# Patient Record
Sex: Female | Born: 1950 | Race: White | Hispanic: No | Marital: Married | Smoking: Former smoker
Health system: Southern US, Community
[De-identification: ages and names within clinical notes are randomized; demographics above are authoritative.]

## PROBLEM LIST (undated history)

## (undated) DIAGNOSIS — F32A Depression, unspecified: Secondary | ICD-10-CM

## (undated) DIAGNOSIS — R7301 Impaired fasting glucose: Secondary | ICD-10-CM

## (undated) DIAGNOSIS — E039 Hypothyroidism, unspecified: Secondary | ICD-10-CM

## (undated) DIAGNOSIS — E049 Nontoxic goiter, unspecified: Secondary | ICD-10-CM

## (undated) DIAGNOSIS — M1990 Unspecified osteoarthritis, unspecified site: Secondary | ICD-10-CM

## (undated) DIAGNOSIS — R7303 Prediabetes: Secondary | ICD-10-CM

## (undated) DIAGNOSIS — I10 Essential (primary) hypertension: Secondary | ICD-10-CM

## (undated) DIAGNOSIS — K219 Gastro-esophageal reflux disease without esophagitis: Secondary | ICD-10-CM

## (undated) DIAGNOSIS — C801 Malignant (primary) neoplasm, unspecified: Secondary | ICD-10-CM

## (undated) DIAGNOSIS — D649 Anemia, unspecified: Secondary | ICD-10-CM

## (undated) DIAGNOSIS — Z8719 Personal history of other diseases of the digestive system: Secondary | ICD-10-CM

## (undated) DIAGNOSIS — F4329 Adjustment disorder with other symptoms: Secondary | ICD-10-CM

## (undated) DIAGNOSIS — I4891 Unspecified atrial fibrillation: Secondary | ICD-10-CM

## (undated) DIAGNOSIS — R0602 Shortness of breath: Secondary | ICD-10-CM

## (undated) DIAGNOSIS — Z9109 Other allergy status, other than to drugs and biological substances: Secondary | ICD-10-CM

## (undated) DIAGNOSIS — J189 Pneumonia, unspecified organism: Secondary | ICD-10-CM

## (undated) DIAGNOSIS — M8580 Other specified disorders of bone density and structure, unspecified site: Secondary | ICD-10-CM

## (undated) HISTORY — DX: Adjustment disorder with other symptoms: F43.29

## (undated) HISTORY — DX: Other allergy status, other than to drugs and biological substances: Z91.09

## (undated) HISTORY — DX: Anemia, unspecified: D64.9

## (undated) HISTORY — PX: TONSILLECTOMY: SUR1361

## (undated) HISTORY — PX: OVARIAN CYST REMOVAL: SHX89

## (undated) HISTORY — DX: Gastro-esophageal reflux disease without esophagitis: K21.9

## (undated) HISTORY — DX: Hypothyroidism, unspecified: E03.9

## (undated) HISTORY — DX: Impaired fasting glucose: R73.01

## (undated) HISTORY — DX: Nontoxic goiter, unspecified: E04.9

## (undated) HISTORY — DX: Unspecified atrial fibrillation: I48.91

## (undated) HISTORY — DX: Other specified disorders of bone density and structure, unspecified site: M85.80

## (undated) HISTORY — DX: Shortness of breath: R06.02

## (undated) HISTORY — DX: Prediabetes: R73.03

---
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
## 1978-06-14 HISTORY — PX: OVARIAN CYST REMOVAL: SHX89

## 1998-05-27 ENCOUNTER — Other Ambulatory Visit: Admission: RE | Admit: 1998-05-27 | Discharge: 1998-05-27 | Payer: Self-pay | Admitting: Obstetrics and Gynecology

## 1999-09-23 ENCOUNTER — Other Ambulatory Visit: Admission: RE | Admit: 1999-09-23 | Discharge: 1999-09-23 | Payer: Self-pay | Admitting: Obstetrics and Gynecology

## 1999-10-14 ENCOUNTER — Encounter: Admission: RE | Admit: 1999-10-14 | Discharge: 1999-10-14 | Payer: Self-pay | Admitting: Obstetrics and Gynecology

## 1999-10-14 ENCOUNTER — Encounter: Payer: Self-pay | Admitting: Obstetrics and Gynecology

## 2002-05-15 ENCOUNTER — Other Ambulatory Visit: Admission: RE | Admit: 2002-05-15 | Discharge: 2002-05-15 | Payer: Self-pay | Admitting: Obstetrics and Gynecology

## 2008-01-26 ENCOUNTER — Ambulatory Visit: Payer: Self-pay | Admitting: Internal Medicine

## 2008-02-02 ENCOUNTER — Encounter: Payer: Self-pay | Admitting: Internal Medicine

## 2008-02-02 ENCOUNTER — Ambulatory Visit: Payer: Self-pay | Admitting: Internal Medicine

## 2008-02-05 ENCOUNTER — Encounter: Payer: Self-pay | Admitting: Internal Medicine

## 2008-10-03 ENCOUNTER — Encounter: Admission: RE | Admit: 2008-10-03 | Discharge: 2008-10-03 | Payer: Self-pay | Admitting: Endocrinology

## 2008-10-17 ENCOUNTER — Other Ambulatory Visit: Admission: RE | Admit: 2008-10-17 | Discharge: 2008-10-17 | Payer: Self-pay | Admitting: Interventional Radiology

## 2008-10-17 ENCOUNTER — Encounter (INDEPENDENT_AMBULATORY_CARE_PROVIDER_SITE_OTHER): Payer: Self-pay | Admitting: Interventional Radiology

## 2008-10-17 ENCOUNTER — Encounter: Admission: RE | Admit: 2008-10-17 | Discharge: 2008-10-17 | Payer: Self-pay | Admitting: Endocrinology

## 2009-09-17 ENCOUNTER — Encounter: Admission: RE | Admit: 2009-09-17 | Discharge: 2009-09-17 | Payer: Self-pay | Admitting: Endocrinology

## 2010-03-20 ENCOUNTER — Encounter: Admission: RE | Admit: 2010-03-20 | Discharge: 2010-03-20 | Payer: Self-pay | Admitting: Internal Medicine

## 2011-04-05 IMAGING — US US BIOPSY
1 series · 9 of 9 positions shown · non-contrast
Comparison: none

CLINICAL DATA: Left thyroid nodule

[Series 1: us biopsy · 9 acquisitions, 9 frames shown]
[im 1/9]
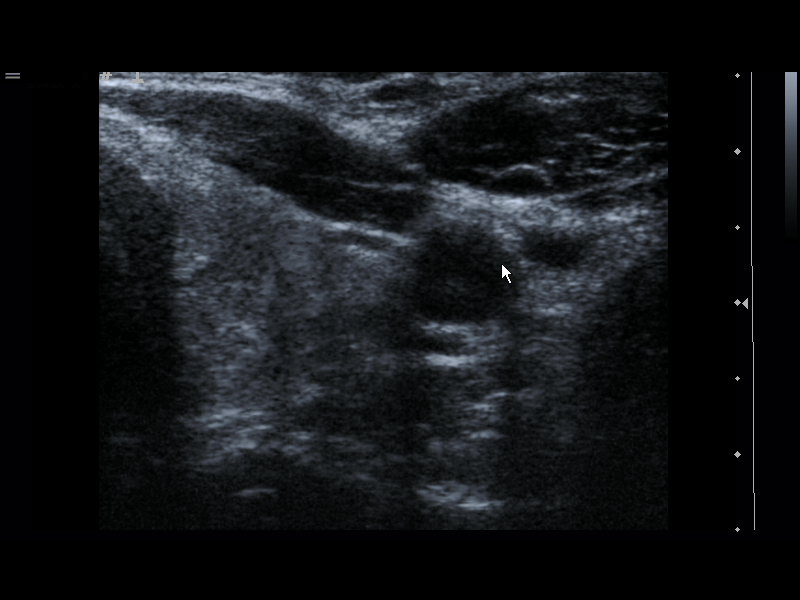
[im 2/9]
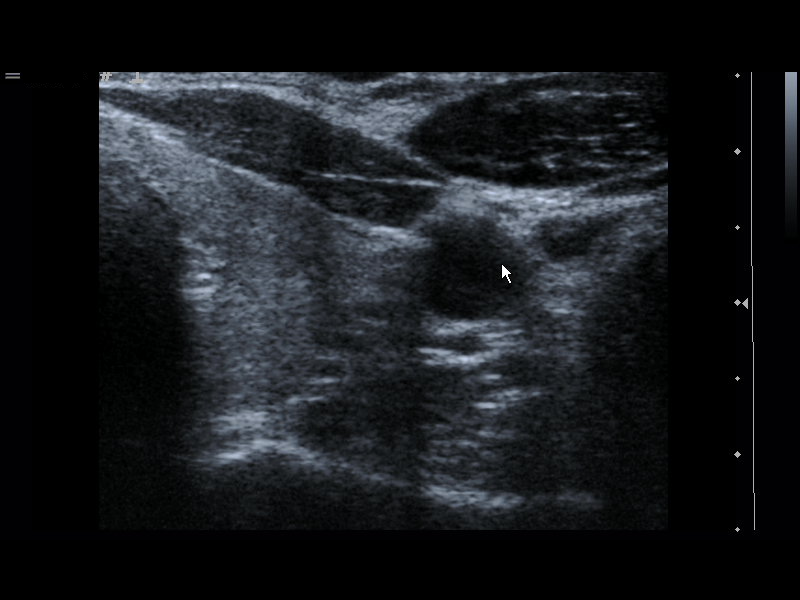
[im 3/9]
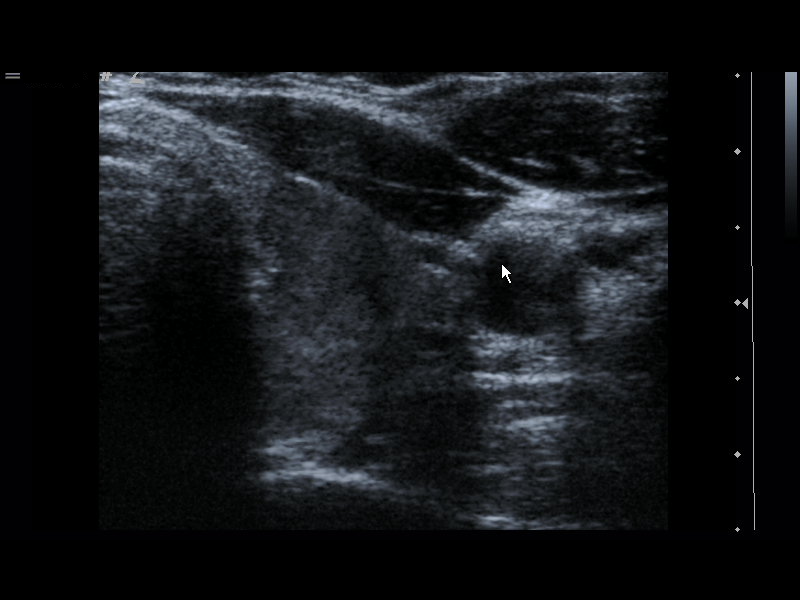
[im 4/9]
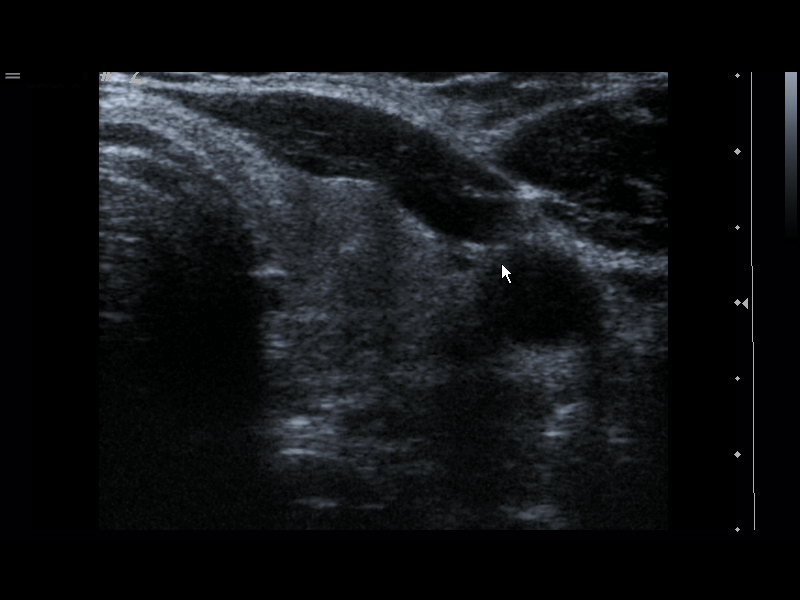
[im 5/9]
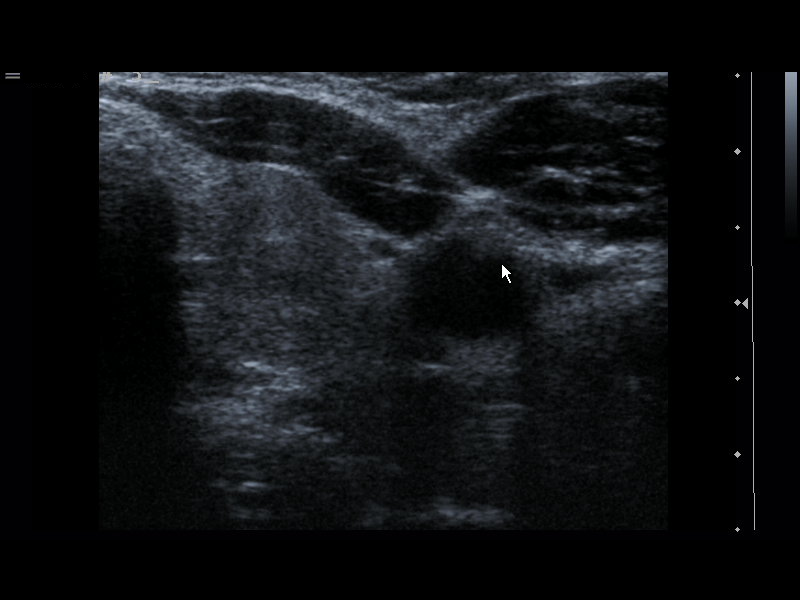
[im 6/9]
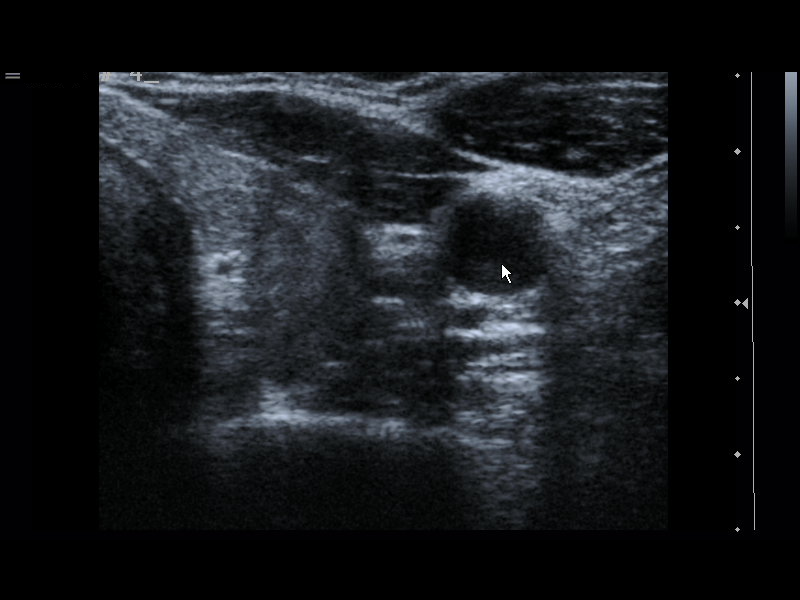
[im 7/9]
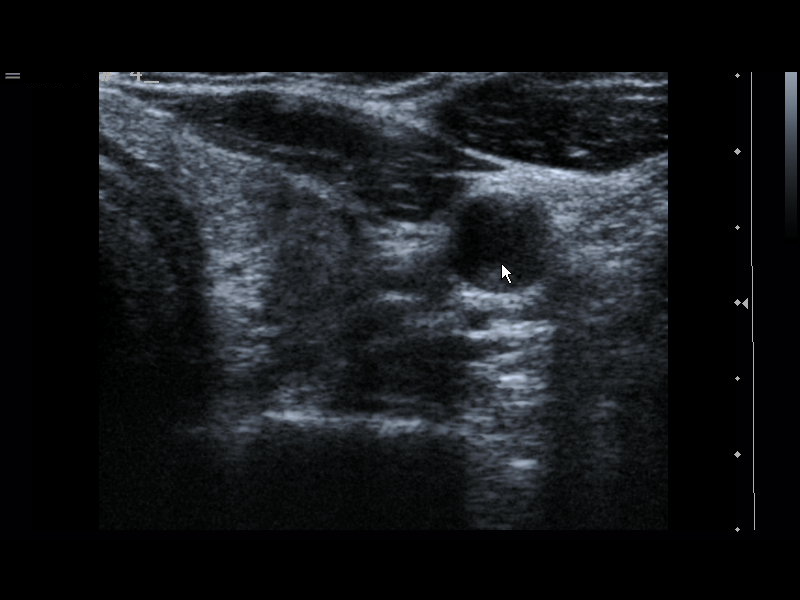
[im 8/9]
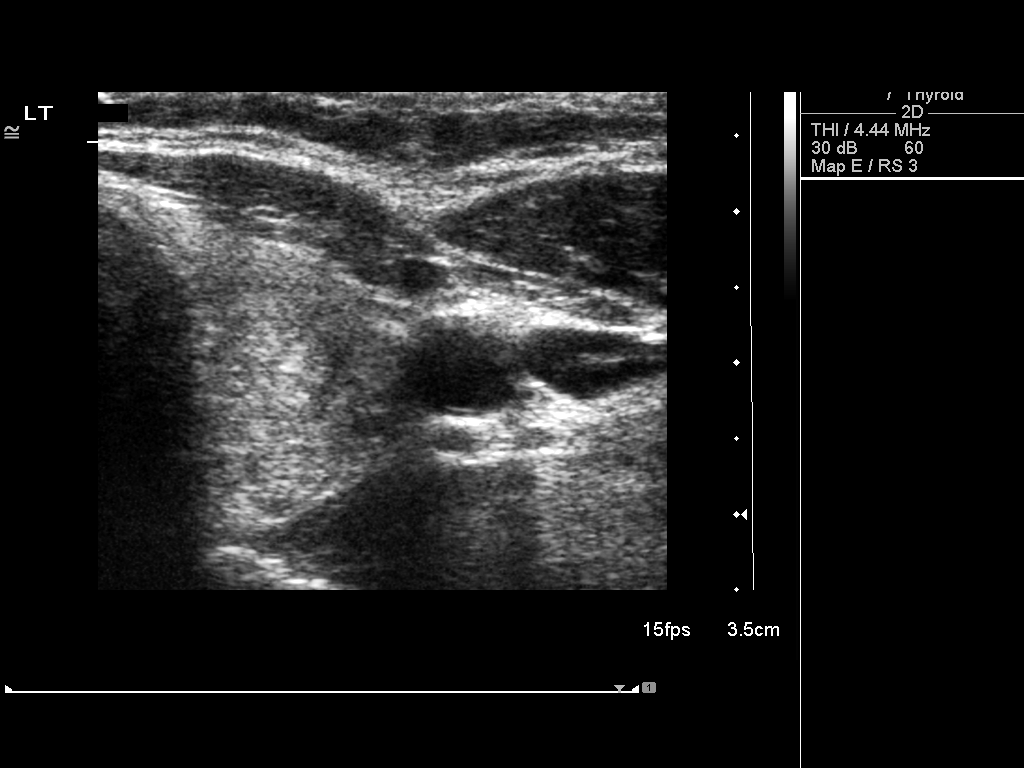
[im 9/9]
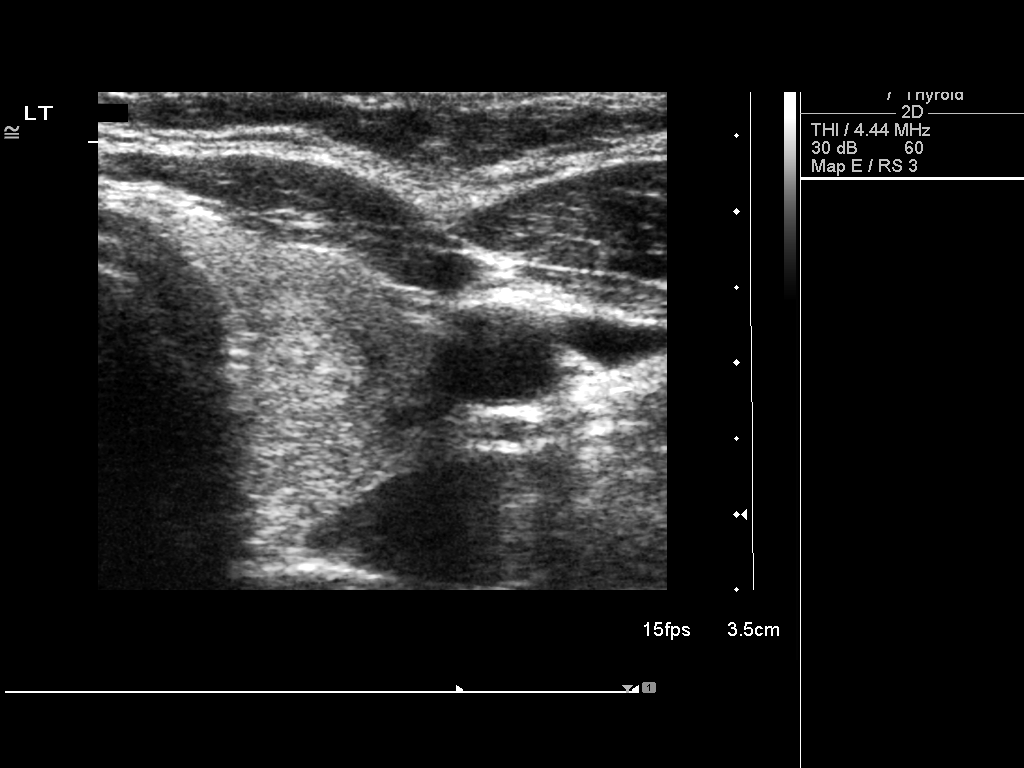

[9 of 9 positions shown; findings below may reference images not displayed]

ULTRASOUND-GUIDED NEEDLE ASPIRATE BIOPSY, LEFT LOBE OF THYROID

The above procedure was discussed with the patient and written
informed consent was obtained. Findings: Ultrasound was performed
to localize and mark an adequate site for the biopsy.  The patient
was then prepped and draped in a normal sterile fashion.  Local
anesthesia was provided with 1% lidocaine.  Using direct ultrasound
guidance, 4 passes were made using 25g needles into the nodule
within the left lobe of the thyroid.  Ultrasound was used to
confirm needle placements on all occasions.  Specimens were sent to
Pathology for analysis.  Post procedural imaging demonstrated no
hematoma or immediate complication.  The patient tolerated the
procedure well.
IMPRESSION: Successful ultrasound guided biopsy of left thyroid
nodule.  The pathology pending.

Read by: Ung, Ahya.-JUMPER

## 2014-02-05 ENCOUNTER — Encounter: Payer: Self-pay | Admitting: Internal Medicine

## 2017-07-27 DIAGNOSIS — M25562 Pain in left knee: Secondary | ICD-10-CM | POA: Diagnosis not present

## 2017-07-29 DIAGNOSIS — R7301 Impaired fasting glucose: Secondary | ICD-10-CM | POA: Diagnosis not present

## 2017-07-29 DIAGNOSIS — E7800 Pure hypercholesterolemia, unspecified: Secondary | ICD-10-CM | POA: Diagnosis not present

## 2017-07-29 DIAGNOSIS — E049 Nontoxic goiter, unspecified: Secondary | ICD-10-CM | POA: Diagnosis not present

## 2017-08-05 DIAGNOSIS — E049 Nontoxic goiter, unspecified: Secondary | ICD-10-CM | POA: Diagnosis not present

## 2017-08-05 DIAGNOSIS — E7800 Pure hypercholesterolemia, unspecified: Secondary | ICD-10-CM | POA: Diagnosis not present

## 2017-08-05 DIAGNOSIS — E039 Hypothyroidism, unspecified: Secondary | ICD-10-CM | POA: Diagnosis not present

## 2017-08-05 DIAGNOSIS — R7301 Impaired fasting glucose: Secondary | ICD-10-CM | POA: Diagnosis not present

## 2017-08-26 DIAGNOSIS — Z1231 Encounter for screening mammogram for malignant neoplasm of breast: Secondary | ICD-10-CM | POA: Diagnosis not present

## 2017-10-25 ENCOUNTER — Telehealth: Payer: Self-pay | Admitting: Orthopedic Surgery

## 2017-10-25 NOTE — Telephone Encounter (Signed)
Erroneous / No Note

## 2017-11-29 ENCOUNTER — Encounter: Payer: Self-pay | Admitting: Internal Medicine

## 2018-06-21 ENCOUNTER — Ambulatory Visit (INDEPENDENT_AMBULATORY_CARE_PROVIDER_SITE_OTHER): Payer: PPO

## 2018-06-21 ENCOUNTER — Encounter: Payer: Self-pay | Admitting: Podiatry

## 2018-06-21 ENCOUNTER — Ambulatory Visit: Payer: PPO | Admitting: Podiatry

## 2018-06-21 ENCOUNTER — Other Ambulatory Visit: Payer: Self-pay | Admitting: Podiatry

## 2018-06-21 VITALS — BP 118/79 | HR 66

## 2018-06-21 DIAGNOSIS — M722 Plantar fascial fibromatosis: Secondary | ICD-10-CM

## 2018-06-21 DIAGNOSIS — M79672 Pain in left foot: Secondary | ICD-10-CM

## 2018-06-21 MED ORDER — TRIAMCINOLONE ACETONIDE 10 MG/ML IJ SUSP
10.0000 mg | Freq: Once | INTRAMUSCULAR | Status: AC
  Administered 2018-06-21: 10 mg

## 2018-06-21 NOTE — Progress Notes (Signed)
Subjective:   Patient ID: Kristin Bishop, female   DOB: 68 y.o.   MRN: [id number]   HPI Patient states she has a lot of pain in her plantar heel for approximate 6 months duration and states she is had orthotics in the past but not for a number of years and those have been beneficial previously.  Patient does not smoke likes to be active   Review of Systems  All other systems reviewed and are negative.       Objective:  Physical Exam Vitals signs and nursing note reviewed.  Constitutional:      Appearance: She is well-developed.  Pulmonary:     Effort: Pulmonary effort is normal.  Musculoskeletal: Normal range of motion.  Skin:    General: Skin is warm.  Neurological:     Mental Status: She is alert.     Neurovascular status intact muscle strength is adequate range of motion within normal limits with exquisite discomfort plantar aspect heel left at the insertional point of the tendon calcaneus with depression of the arch was a complicating factor to her condition.  Noted to have good digital perfusion well oriented x3     Assessment:  Acute plantar fasciitis left with inflammation fluid around the medial band depression of the arch noted     Plan:  H&P x-rays reviewed with patient.  Today I injected the plantar fashion after sterile prep 3 mg Kenalog 5 mg Xylocaine and applied fascial brace to lift up the arch with instructions on usage.  Understands importance of the fascial brace due to the collapse of her arch and long-term will probably get her into new orthotics  X-rays indicate small spur formation no indications of stress fracture with moderate depression of the arch

## 2018-06-21 NOTE — Patient Instructions (Signed)

## 2018-07-05 ENCOUNTER — Ambulatory Visit: Payer: PPO | Admitting: Podiatry

## 2018-07-05 ENCOUNTER — Encounter: Payer: Self-pay | Admitting: Podiatry

## 2018-07-05 DIAGNOSIS — M722 Plantar fascial fibromatosis: Secondary | ICD-10-CM

## 2018-07-05 NOTE — Progress Notes (Signed)
Subjective:   Patient ID: Kristin Bishop, female   DOB: 68 y.o.   MRN: [id number]   HPI Patient states still having quite a bit of pain in my left heel and I would really like to avoid surgery and I feel like I have trouble bearing weight on the heel at times   ROS      Objective:  Physical Exam  Neurovascular status intact with patient's left heel still showing discomfort upon palpation with chronic nature to this condition of at least 6 to 8 months duration     Assessment:  Chronic fasciitis left foot patient has great difficulty keeping off of it but would like to have reduced weightbearing     Plan:  We are trying to avoid surgery on this patient and at this point I went ahead and dispensed air fracture walker to completely eliminate weightbearing forces on the heel and discussed possibility for shockwave if symptoms do not improve in the next 3 to 4 weeks.  Ultimately we could require open surgery but we are to do everything we can to avoid this

## 2018-08-10 DIAGNOSIS — E7800 Pure hypercholesterolemia, unspecified: Secondary | ICD-10-CM | POA: Diagnosis not present

## 2018-08-10 DIAGNOSIS — E039 Hypothyroidism, unspecified: Secondary | ICD-10-CM | POA: Diagnosis not present

## 2018-08-10 DIAGNOSIS — R7301 Impaired fasting glucose: Secondary | ICD-10-CM | POA: Diagnosis not present

## 2018-08-11 DIAGNOSIS — E039 Hypothyroidism, unspecified: Secondary | ICD-10-CM | POA: Diagnosis not present

## 2018-08-11 DIAGNOSIS — E049 Nontoxic goiter, unspecified: Secondary | ICD-10-CM | POA: Diagnosis not present

## 2018-08-11 DIAGNOSIS — R7301 Impaired fasting glucose: Secondary | ICD-10-CM | POA: Diagnosis not present

## 2018-08-11 DIAGNOSIS — E7800 Pure hypercholesterolemia, unspecified: Secondary | ICD-10-CM | POA: Diagnosis not present

## 2018-08-21 DIAGNOSIS — Z0000 Encounter for general adult medical examination without abnormal findings: Secondary | ICD-10-CM | POA: Diagnosis not present

## 2018-10-30 DIAGNOSIS — Z1211 Encounter for screening for malignant neoplasm of colon: Secondary | ICD-10-CM | POA: Diagnosis not present

## 2018-10-30 DIAGNOSIS — Z1212 Encounter for screening for malignant neoplasm of rectum: Secondary | ICD-10-CM | POA: Diagnosis not present

## 2018-12-12 DIAGNOSIS — Z1231 Encounter for screening mammogram for malignant neoplasm of breast: Secondary | ICD-10-CM | POA: Diagnosis not present

## 2019-08-03 DIAGNOSIS — E7800 Pure hypercholesterolemia, unspecified: Secondary | ICD-10-CM | POA: Diagnosis not present

## 2019-08-03 DIAGNOSIS — R7301 Impaired fasting glucose: Secondary | ICD-10-CM | POA: Diagnosis not present

## 2019-08-03 DIAGNOSIS — E039 Hypothyroidism, unspecified: Secondary | ICD-10-CM | POA: Diagnosis not present

## 2019-08-10 DIAGNOSIS — E039 Hypothyroidism, unspecified: Secondary | ICD-10-CM | POA: Diagnosis not present

## 2019-08-10 DIAGNOSIS — E7800 Pure hypercholesterolemia, unspecified: Secondary | ICD-10-CM | POA: Diagnosis not present

## 2019-08-10 DIAGNOSIS — R7301 Impaired fasting glucose: Secondary | ICD-10-CM | POA: Diagnosis not present

## 2019-08-10 DIAGNOSIS — E049 Nontoxic goiter, unspecified: Secondary | ICD-10-CM | POA: Diagnosis not present

## 2019-08-22 DIAGNOSIS — S61219A Laceration without foreign body of unspecified finger without damage to nail, initial encounter: Secondary | ICD-10-CM | POA: Diagnosis not present

## 2019-08-22 DIAGNOSIS — Z23 Encounter for immunization: Secondary | ICD-10-CM | POA: Diagnosis not present

## 2019-08-22 DIAGNOSIS — Z0000 Encounter for general adult medical examination without abnormal findings: Secondary | ICD-10-CM | POA: Diagnosis not present

## 2019-12-18 DIAGNOSIS — Z1231 Encounter for screening mammogram for malignant neoplasm of breast: Secondary | ICD-10-CM | POA: Diagnosis not present

## 2020-01-31 DIAGNOSIS — E039 Hypothyroidism, unspecified: Secondary | ICD-10-CM | POA: Diagnosis not present

## 2020-01-31 DIAGNOSIS — R7301 Impaired fasting glucose: Secondary | ICD-10-CM | POA: Diagnosis not present

## 2020-01-31 DIAGNOSIS — E7800 Pure hypercholesterolemia, unspecified: Secondary | ICD-10-CM | POA: Diagnosis not present

## 2020-02-07 DIAGNOSIS — R7301 Impaired fasting glucose: Secondary | ICD-10-CM | POA: Diagnosis not present

## 2020-02-07 DIAGNOSIS — E039 Hypothyroidism, unspecified: Secondary | ICD-10-CM | POA: Diagnosis not present

## 2020-02-07 DIAGNOSIS — Z139 Encounter for screening, unspecified: Secondary | ICD-10-CM | POA: Diagnosis not present

## 2020-02-07 DIAGNOSIS — E7800 Pure hypercholesterolemia, unspecified: Secondary | ICD-10-CM | POA: Diagnosis not present

## 2020-02-07 DIAGNOSIS — E049 Nontoxic goiter, unspecified: Secondary | ICD-10-CM | POA: Diagnosis not present

## 2020-02-20 DIAGNOSIS — M85852 Other specified disorders of bone density and structure, left thigh: Secondary | ICD-10-CM | POA: Diagnosis not present

## 2020-02-20 DIAGNOSIS — M85851 Other specified disorders of bone density and structure, right thigh: Secondary | ICD-10-CM | POA: Diagnosis not present

## 2020-02-20 DIAGNOSIS — Z780 Asymptomatic menopausal state: Secondary | ICD-10-CM | POA: Diagnosis not present

## 2020-06-14 HISTORY — PX: MOHS SURGERY: SHX181

## 2020-08-04 DIAGNOSIS — E039 Hypothyroidism, unspecified: Secondary | ICD-10-CM | POA: Diagnosis not present

## 2020-08-04 DIAGNOSIS — R7301 Impaired fasting glucose: Secondary | ICD-10-CM | POA: Diagnosis not present

## 2020-08-04 DIAGNOSIS — E7800 Pure hypercholesterolemia, unspecified: Secondary | ICD-10-CM | POA: Diagnosis not present

## 2020-08-11 DIAGNOSIS — E039 Hypothyroidism, unspecified: Secondary | ICD-10-CM | POA: Diagnosis not present

## 2020-08-11 DIAGNOSIS — E7800 Pure hypercholesterolemia, unspecified: Secondary | ICD-10-CM | POA: Diagnosis not present

## 2020-08-11 DIAGNOSIS — E049 Nontoxic goiter, unspecified: Secondary | ICD-10-CM | POA: Diagnosis not present

## 2020-08-11 DIAGNOSIS — R7301 Impaired fasting glucose: Secondary | ICD-10-CM | POA: Diagnosis not present

## 2020-08-25 DIAGNOSIS — M8580 Other specified disorders of bone density and structure, unspecified site: Secondary | ICD-10-CM | POA: Diagnosis not present

## 2020-08-25 DIAGNOSIS — Z79899 Other long term (current) drug therapy: Secondary | ICD-10-CM | POA: Diagnosis not present

## 2020-08-25 DIAGNOSIS — K219 Gastro-esophageal reflux disease without esophagitis: Secondary | ICD-10-CM | POA: Diagnosis not present

## 2020-08-25 DIAGNOSIS — Z0000 Encounter for general adult medical examination without abnormal findings: Secondary | ICD-10-CM | POA: Diagnosis not present

## 2020-11-13 DIAGNOSIS — L821 Other seborrheic keratosis: Secondary | ICD-10-CM | POA: Diagnosis not present

## 2020-11-13 DIAGNOSIS — D2371 Other benign neoplasm of skin of right lower limb, including hip: Secondary | ICD-10-CM | POA: Diagnosis not present

## 2020-11-13 DIAGNOSIS — L814 Other melanin hyperpigmentation: Secondary | ICD-10-CM | POA: Diagnosis not present

## 2020-12-23 DIAGNOSIS — Z1231 Encounter for screening mammogram for malignant neoplasm of breast: Secondary | ICD-10-CM | POA: Diagnosis not present

## 2021-02-03 DIAGNOSIS — E039 Hypothyroidism, unspecified: Secondary | ICD-10-CM | POA: Diagnosis not present

## 2021-02-03 DIAGNOSIS — E7800 Pure hypercholesterolemia, unspecified: Secondary | ICD-10-CM | POA: Diagnosis not present

## 2021-02-03 DIAGNOSIS — R7301 Impaired fasting glucose: Secondary | ICD-10-CM | POA: Diagnosis not present

## 2021-02-10 DIAGNOSIS — R7301 Impaired fasting glucose: Secondary | ICD-10-CM | POA: Diagnosis not present

## 2021-02-10 DIAGNOSIS — E7800 Pure hypercholesterolemia, unspecified: Secondary | ICD-10-CM | POA: Diagnosis not present

## 2021-02-10 DIAGNOSIS — E049 Nontoxic goiter, unspecified: Secondary | ICD-10-CM | POA: Diagnosis not present

## 2021-02-10 DIAGNOSIS — E039 Hypothyroidism, unspecified: Secondary | ICD-10-CM | POA: Diagnosis not present

## 2021-02-10 DIAGNOSIS — M8580 Other specified disorders of bone density and structure, unspecified site: Secondary | ICD-10-CM | POA: Diagnosis not present

## 2021-08-31 DIAGNOSIS — E049 Nontoxic goiter, unspecified: Secondary | ICD-10-CM | POA: Diagnosis not present

## 2021-08-31 DIAGNOSIS — R7301 Impaired fasting glucose: Secondary | ICD-10-CM | POA: Diagnosis not present

## 2021-08-31 DIAGNOSIS — E7800 Pure hypercholesterolemia, unspecified: Secondary | ICD-10-CM | POA: Diagnosis not present

## 2021-08-31 DIAGNOSIS — M8580 Other specified disorders of bone density and structure, unspecified site: Secondary | ICD-10-CM | POA: Diagnosis not present

## 2021-08-31 DIAGNOSIS — E039 Hypothyroidism, unspecified: Secondary | ICD-10-CM | POA: Diagnosis not present

## 2021-08-31 DIAGNOSIS — K219 Gastro-esophageal reflux disease without esophagitis: Secondary | ICD-10-CM | POA: Diagnosis not present

## 2021-08-31 DIAGNOSIS — Z0000 Encounter for general adult medical examination without abnormal findings: Secondary | ICD-10-CM | POA: Diagnosis not present

## 2021-10-16 DIAGNOSIS — C44622 Squamous cell carcinoma of skin of right upper limb, including shoulder: Secondary | ICD-10-CM | POA: Diagnosis not present

## 2021-10-16 DIAGNOSIS — D485 Neoplasm of uncertain behavior of skin: Secondary | ICD-10-CM | POA: Diagnosis not present

## 2021-11-10 DIAGNOSIS — E039 Hypothyroidism, unspecified: Secondary | ICD-10-CM | POA: Diagnosis not present

## 2021-11-23 DIAGNOSIS — C44622 Squamous cell carcinoma of skin of right upper limb, including shoulder: Secondary | ICD-10-CM | POA: Diagnosis not present

## 2021-12-04 DIAGNOSIS — Z23 Encounter for immunization: Secondary | ICD-10-CM | POA: Diagnosis not present

## 2021-12-07 DIAGNOSIS — Z1211 Encounter for screening for malignant neoplasm of colon: Secondary | ICD-10-CM | POA: Diagnosis not present

## 2021-12-07 DIAGNOSIS — Z1212 Encounter for screening for malignant neoplasm of rectum: Secondary | ICD-10-CM | POA: Diagnosis not present

## 2021-12-11 LAB — COLOGUARD: COLOGUARD: NEGATIVE

## 2021-12-11 LAB — EXTERNAL GENERIC LAB PROCEDURE: COLOGUARD: NEGATIVE

## 2022-01-07 DIAGNOSIS — Z1231 Encounter for screening mammogram for malignant neoplasm of breast: Secondary | ICD-10-CM | POA: Diagnosis not present

## 2022-02-09 DIAGNOSIS — K219 Gastro-esophageal reflux disease without esophagitis: Secondary | ICD-10-CM | POA: Diagnosis not present

## 2022-02-09 DIAGNOSIS — E039 Hypothyroidism, unspecified: Secondary | ICD-10-CM | POA: Diagnosis not present

## 2022-02-09 DIAGNOSIS — E7800 Pure hypercholesterolemia, unspecified: Secondary | ICD-10-CM | POA: Diagnosis not present

## 2022-02-09 DIAGNOSIS — Z79899 Other long term (current) drug therapy: Secondary | ICD-10-CM | POA: Diagnosis not present

## 2022-02-09 DIAGNOSIS — R7301 Impaired fasting glucose: Secondary | ICD-10-CM | POA: Diagnosis not present

## 2022-02-16 DIAGNOSIS — D649 Anemia, unspecified: Secondary | ICD-10-CM | POA: Diagnosis not present

## 2022-02-16 DIAGNOSIS — E7800 Pure hypercholesterolemia, unspecified: Secondary | ICD-10-CM | POA: Diagnosis not present

## 2022-02-16 DIAGNOSIS — E039 Hypothyroidism, unspecified: Secondary | ICD-10-CM | POA: Diagnosis not present

## 2022-02-16 DIAGNOSIS — K219 Gastro-esophageal reflux disease without esophagitis: Secondary | ICD-10-CM | POA: Diagnosis not present

## 2022-02-16 DIAGNOSIS — M8580 Other specified disorders of bone density and structure, unspecified site: Secondary | ICD-10-CM | POA: Diagnosis not present

## 2022-02-16 DIAGNOSIS — E049 Nontoxic goiter, unspecified: Secondary | ICD-10-CM | POA: Diagnosis not present

## 2022-02-16 DIAGNOSIS — R7301 Impaired fasting glucose: Secondary | ICD-10-CM | POA: Diagnosis not present

## 2022-02-24 DIAGNOSIS — R5383 Other fatigue: Secondary | ICD-10-CM | POA: Diagnosis not present

## 2022-02-24 DIAGNOSIS — D649 Anemia, unspecified: Secondary | ICD-10-CM | POA: Diagnosis not present

## 2022-03-08 DIAGNOSIS — R5383 Other fatigue: Secondary | ICD-10-CM | POA: Diagnosis not present

## 2022-03-08 DIAGNOSIS — K219 Gastro-esophageal reflux disease without esophagitis: Secondary | ICD-10-CM | POA: Diagnosis not present

## 2022-03-08 DIAGNOSIS — D509 Iron deficiency anemia, unspecified: Secondary | ICD-10-CM | POA: Diagnosis not present

## 2022-03-18 DIAGNOSIS — Z23 Encounter for immunization: Secondary | ICD-10-CM | POA: Diagnosis not present

## 2022-03-23 DIAGNOSIS — K449 Diaphragmatic hernia without obstruction or gangrene: Secondary | ICD-10-CM | POA: Diagnosis not present

## 2022-03-23 DIAGNOSIS — K5732 Diverticulitis of large intestine without perforation or abscess without bleeding: Secondary | ICD-10-CM | POA: Diagnosis not present

## 2022-03-23 DIAGNOSIS — K440 Diaphragmatic hernia with obstruction, without gangrene: Secondary | ICD-10-CM | POA: Diagnosis not present

## 2022-03-23 DIAGNOSIS — D123 Benign neoplasm of transverse colon: Secondary | ICD-10-CM | POA: Diagnosis not present

## 2022-03-23 DIAGNOSIS — K635 Polyp of colon: Secondary | ICD-10-CM | POA: Diagnosis not present

## 2022-03-23 DIAGNOSIS — K5730 Diverticulosis of large intestine without perforation or abscess without bleeding: Secondary | ICD-10-CM | POA: Diagnosis not present

## 2022-03-23 DIAGNOSIS — D509 Iron deficiency anemia, unspecified: Secondary | ICD-10-CM | POA: Diagnosis not present

## 2022-04-02 ENCOUNTER — Encounter (HOSPITAL_COMMUNITY)
Admission: RE | Disposition: A | Discharge status: Home / Self Care | Payer: Self-pay | Source: Home / Self Care | Attending: Gastroenterology

## 2022-04-02 ENCOUNTER — Ambulatory Visit (HOSPITAL_COMMUNITY)
Admission: RE | Admit: 2022-04-02 | Discharge: 2022-04-02 | Disposition: A | Discharge status: Home / Self Care | Payer: Medicare Other | Attending: Gastroenterology | Admitting: Gastroenterology

## 2022-04-02 DIAGNOSIS — D509 Iron deficiency anemia, unspecified: Secondary | ICD-10-CM | POA: Diagnosis not present

## 2022-04-02 DIAGNOSIS — D649 Anemia, unspecified: Secondary | ICD-10-CM | POA: Diagnosis present

## 2022-04-02 DIAGNOSIS — M25562 Pain in left knee: Secondary | ICD-10-CM | POA: Diagnosis not present

## 2022-04-02 HISTORY — PX: GIVENS CAPSULE STUDY: SHX5432

## 2022-04-02 SURGERY — IMAGING PROCEDURE, GI TRACT, INTRALUMINAL, VIA CAPSULE

## 2022-04-02 SURGICAL SUPPLY — 1 items: TOWEL COTTON PACK 4EA (MISCELLANEOUS) ×2 IMPLANT

## 2022-04-05 ENCOUNTER — Encounter (HOSPITAL_COMMUNITY): Payer: Self-pay | Admitting: Gastroenterology

## 2022-04-06 DIAGNOSIS — D509 Iron deficiency anemia, unspecified: Secondary | ICD-10-CM | POA: Diagnosis not present

## 2022-05-04 DIAGNOSIS — L820 Inflamed seborrheic keratosis: Secondary | ICD-10-CM | POA: Diagnosis not present

## 2022-05-04 DIAGNOSIS — L570 Actinic keratosis: Secondary | ICD-10-CM | POA: Diagnosis not present

## 2022-07-07 DIAGNOSIS — D509 Iron deficiency anemia, unspecified: Secondary | ICD-10-CM | POA: Diagnosis not present

## 2022-07-14 DIAGNOSIS — Z85828 Personal history of other malignant neoplasm of skin: Secondary | ICD-10-CM | POA: Diagnosis not present

## 2022-07-14 DIAGNOSIS — L570 Actinic keratosis: Secondary | ICD-10-CM | POA: Diagnosis not present

## 2022-07-14 DIAGNOSIS — D235 Other benign neoplasm of skin of trunk: Secondary | ICD-10-CM | POA: Diagnosis not present

## 2022-07-14 DIAGNOSIS — L821 Other seborrheic keratosis: Secondary | ICD-10-CM | POA: Diagnosis not present

## 2022-07-14 DIAGNOSIS — L814 Other melanin hyperpigmentation: Secondary | ICD-10-CM | POA: Diagnosis not present

## 2022-07-14 DIAGNOSIS — D485 Neoplasm of uncertain behavior of skin: Secondary | ICD-10-CM | POA: Diagnosis not present

## 2022-08-30 DIAGNOSIS — R7309 Other abnormal glucose: Secondary | ICD-10-CM | POA: Diagnosis not present

## 2022-08-30 DIAGNOSIS — R946 Abnormal results of thyroid function studies: Secondary | ICD-10-CM | POA: Diagnosis not present

## 2022-08-30 DIAGNOSIS — Z79899 Other long term (current) drug therapy: Secondary | ICD-10-CM | POA: Diagnosis not present

## 2022-08-30 DIAGNOSIS — Z13220 Encounter for screening for lipoid disorders: Secondary | ICD-10-CM | POA: Diagnosis not present

## 2022-09-06 DIAGNOSIS — K219 Gastro-esophageal reflux disease without esophagitis: Secondary | ICD-10-CM | POA: Diagnosis not present

## 2022-09-06 DIAGNOSIS — Z0000 Encounter for general adult medical examination without abnormal findings: Secondary | ICD-10-CM | POA: Diagnosis not present

## 2022-09-06 DIAGNOSIS — Z862 Personal history of diseases of the blood and blood-forming organs and certain disorders involving the immune mechanism: Secondary | ICD-10-CM | POA: Diagnosis not present

## 2022-09-06 DIAGNOSIS — E049 Nontoxic goiter, unspecified: Secondary | ICD-10-CM | POA: Diagnosis not present

## 2023-01-13 DIAGNOSIS — Z1231 Encounter for screening mammogram for malignant neoplasm of breast: Secondary | ICD-10-CM | POA: Diagnosis not present

## 2023-02-11 DIAGNOSIS — E039 Hypothyroidism, unspecified: Secondary | ICD-10-CM | POA: Diagnosis not present

## 2023-02-11 DIAGNOSIS — M8580 Other specified disorders of bone density and structure, unspecified site: Secondary | ICD-10-CM | POA: Diagnosis not present

## 2023-02-11 DIAGNOSIS — R7301 Impaired fasting glucose: Secondary | ICD-10-CM | POA: Diagnosis not present

## 2023-02-11 DIAGNOSIS — Z862 Personal history of diseases of the blood and blood-forming organs and certain disorders involving the immune mechanism: Secondary | ICD-10-CM | POA: Diagnosis not present

## 2023-02-18 DIAGNOSIS — E049 Nontoxic goiter, unspecified: Secondary | ICD-10-CM | POA: Diagnosis not present

## 2023-02-18 DIAGNOSIS — E7800 Pure hypercholesterolemia, unspecified: Secondary | ICD-10-CM | POA: Diagnosis not present

## 2023-02-18 DIAGNOSIS — M8580 Other specified disorders of bone density and structure, unspecified site: Secondary | ICD-10-CM | POA: Diagnosis not present

## 2023-02-18 DIAGNOSIS — Z862 Personal history of diseases of the blood and blood-forming organs and certain disorders involving the immune mechanism: Secondary | ICD-10-CM | POA: Diagnosis not present

## 2023-02-18 DIAGNOSIS — Z23 Encounter for immunization: Secondary | ICD-10-CM | POA: Diagnosis not present

## 2023-02-18 DIAGNOSIS — E039 Hypothyroidism, unspecified: Secondary | ICD-10-CM | POA: Diagnosis not present

## 2023-02-18 DIAGNOSIS — R7301 Impaired fasting glucose: Secondary | ICD-10-CM | POA: Diagnosis not present

## 2023-04-14 ENCOUNTER — Encounter (HOSPITAL_COMMUNITY): Payer: Self-pay | Admitting: Gastroenterology

## 2023-07-19 DIAGNOSIS — L814 Other melanin hyperpigmentation: Secondary | ICD-10-CM | POA: Diagnosis not present

## 2023-07-19 DIAGNOSIS — L821 Other seborrheic keratosis: Secondary | ICD-10-CM | POA: Diagnosis not present

## 2023-07-19 DIAGNOSIS — Z85828 Personal history of other malignant neoplasm of skin: Secondary | ICD-10-CM | POA: Diagnosis not present

## 2023-07-19 DIAGNOSIS — D2371 Other benign neoplasm of skin of right lower limb, including hip: Secondary | ICD-10-CM | POA: Diagnosis not present

## 2023-07-19 DIAGNOSIS — L570 Actinic keratosis: Secondary | ICD-10-CM | POA: Diagnosis not present

## 2023-09-01 DIAGNOSIS — Z79899 Other long term (current) drug therapy: Secondary | ICD-10-CM | POA: Diagnosis not present

## 2023-09-01 DIAGNOSIS — R7301 Impaired fasting glucose: Secondary | ICD-10-CM | POA: Diagnosis not present

## 2023-09-01 DIAGNOSIS — E039 Hypothyroidism, unspecified: Secondary | ICD-10-CM | POA: Diagnosis not present

## 2023-09-01 DIAGNOSIS — E7800 Pure hypercholesterolemia, unspecified: Secondary | ICD-10-CM | POA: Diagnosis not present

## 2023-09-08 DIAGNOSIS — Z0000 Encounter for general adult medical examination without abnormal findings: Secondary | ICD-10-CM | POA: Diagnosis not present

## 2023-09-08 DIAGNOSIS — R7301 Impaired fasting glucose: Secondary | ICD-10-CM | POA: Diagnosis not present

## 2023-09-08 DIAGNOSIS — R0602 Shortness of breath: Secondary | ICD-10-CM | POA: Diagnosis not present

## 2023-09-08 DIAGNOSIS — E039 Hypothyroidism, unspecified: Secondary | ICD-10-CM | POA: Diagnosis not present

## 2023-09-08 DIAGNOSIS — D649 Anemia, unspecified: Secondary | ICD-10-CM | POA: Diagnosis not present

## 2023-09-14 DIAGNOSIS — R0602 Shortness of breath: Secondary | ICD-10-CM | POA: Diagnosis not present

## 2023-10-10 DIAGNOSIS — I4891 Unspecified atrial fibrillation: Secondary | ICD-10-CM | POA: Diagnosis not present

## 2023-10-10 DIAGNOSIS — R0602 Shortness of breath: Secondary | ICD-10-CM | POA: Diagnosis not present

## 2023-11-11 DIAGNOSIS — E039 Hypothyroidism, unspecified: Secondary | ICD-10-CM | POA: Diagnosis not present

## 2023-11-11 DIAGNOSIS — R0602 Shortness of breath: Secondary | ICD-10-CM | POA: Diagnosis not present

## 2023-11-11 DIAGNOSIS — Z862 Personal history of diseases of the blood and blood-forming organs and certain disorders involving the immune mechanism: Secondary | ICD-10-CM | POA: Diagnosis not present

## 2023-11-11 DIAGNOSIS — R5383 Other fatigue: Secondary | ICD-10-CM | POA: Diagnosis not present

## 2023-11-11 DIAGNOSIS — I4891 Unspecified atrial fibrillation: Secondary | ICD-10-CM | POA: Diagnosis not present

## 2023-11-11 DIAGNOSIS — E559 Vitamin D deficiency, unspecified: Secondary | ICD-10-CM | POA: Diagnosis not present

## 2023-11-11 DIAGNOSIS — D519 Vitamin B12 deficiency anemia, unspecified: Secondary | ICD-10-CM | POA: Diagnosis not present

## 2023-11-16 ENCOUNTER — Other Ambulatory Visit: Payer: Self-pay | Admitting: Family Medicine

## 2023-11-16 DIAGNOSIS — R0602 Shortness of breath: Secondary | ICD-10-CM

## 2023-11-23 ENCOUNTER — Ambulatory Visit
Admission: RE | Admit: 2023-11-23 | Discharge: 2023-11-23 | Disposition: A | Discharge status: Home / Self Care | Source: Ambulatory Visit | Attending: Family Medicine | Admitting: Family Medicine

## 2023-11-23 DIAGNOSIS — R0602 Shortness of breath: Secondary | ICD-10-CM

## 2023-11-23 DIAGNOSIS — K449 Diaphragmatic hernia without obstruction or gangrene: Secondary | ICD-10-CM | POA: Diagnosis not present

## 2023-11-23 MED ORDER — IOPAMIDOL (ISOVUE-300) INJECTION 61%
75.0000 mL | Freq: Once | INTRAVENOUS | Status: AC | PRN
  Administered 2023-11-23: 75 mL via INTRAVENOUS

## 2023-12-29 ENCOUNTER — Telehealth: Payer: Self-pay

## 2023-12-29 DIAGNOSIS — K449 Diaphragmatic hernia without obstruction or gangrene: Secondary | ICD-10-CM | POA: Diagnosis not present

## 2023-12-29 DIAGNOSIS — I480 Paroxysmal atrial fibrillation: Secondary | ICD-10-CM | POA: Diagnosis not present

## 2023-12-29 DIAGNOSIS — D649 Anemia, unspecified: Secondary | ICD-10-CM | POA: Diagnosis not present

## 2023-12-29 NOTE — Telephone Encounter (Signed)
   Pre-operative Risk Assessment    Patient Name: Kristin Bishop  DOB: 02-02-1951 MRN: [id number]   Date of last office visit: NONE Date of next office visit: 02/06/24 PETER SWAZILAND, MD (NEW PT APPT)   Request for Surgical Clearance    Procedure:  HIATAL HERNIA REPAIR  Date of Surgery:  Clearance TBD                                Surgeon:  HERLENE BUREAU, MD Surgeon's Group or Practice Name:  [facility] Phone number:  [phone] Fax number:  [phone]  ATTN: LARRAINE ELLEN, CMA   Type of Clearance Requested:   - Medical    Type of Anesthesia:  General    Additional requests/questions:    Signed, Lucie DELENA Ku   12/29/2023, 11:27 AM

## 2023-12-29 NOTE — Telephone Encounter (Signed)
   Name: Kristin Bishop  DOB: 03-Jun-1951  MRN: [id number]  Primary Cardiologist: None  Chart reviewed as part of pre-operative protocol coverage. The patient has an upcoming visit scheduled with Dr. Swaziland on 02/06/2024 at which time clearance can be addressed in case there are any issues that would impact surgical recommendations.  Hiatal hernia repair is not scheduled until TBD as below. I added preop FYI to appointment note so that provider is aware to address at time of outpatient visit.  Per office protocol the cardiology provider should forward their finalized clearance decision and recommendations regarding antiplatelet therapy to the requesting party below.    I will route this message as FYI to requesting party and remove this message from the preop box as separate preop APP input not needed at this time.   Please call with any questions.  [name], NP  12/29/2023, 12:52 PM

## 2024-01-19 DIAGNOSIS — Z1231 Encounter for screening mammogram for malignant neoplasm of breast: Secondary | ICD-10-CM | POA: Diagnosis not present

## 2024-01-24 NOTE — Progress Notes (Signed)
 Cardiology Office Note:    Date:  02/06/2024   ID:  Zarai, Orsborn 07/10/1950, MRN [id number]  PCP:  Gerome Brunet, DO    HeartCare Providers Cardiologist:  None     Referring MD: Gerome Brunet, DO   Chief Complaint  Patient presents with   Atrial Fibrillation   Shortness of Breath    History of Present Illness:    Kristin Bishop is a 73 y.o. female is seen at the request of Dr Gerome for evaluation of SOB, Afib and for pre op clearance for repair of hiatal hernia. She notes that in March she was found to be in Afib with rate of 120. Not really aware other than the fact that she feels anxious at times and this may last for hours. She was placed on ASA and low dose metoprolol . Echo was done by PCP and reported as normal. She also has been diagnosed with a large hiatal hernia extending into the thorax. She has intermittent iron deficiency anemia. She does complain of SOB and fatigue. Also complains of a lot of digestive issues and has to eat small amounts. No chest pain.   Past Medical History:  Diagnosis Date   Anemia    Atrial fibrillation (HCC)    Environmental allergies    GERD (gastroesophageal reflux disease)    Goiter    Hypothyroidism    IFG (impaired fasting glucose)    Osteopenia    Prediabetes    SOB (shortness of breath)    Stress and adjustment reaction     Past Surgical History:  Procedure Laterality Date   GIVENS CAPSULE STUDY N/A 04/02/2022   Procedure: GIVENS CAPSULE STUDY;  Surgeon: Rollin Dover, MD;  Location: [facility] END[facility];  Service: Gastroenterology;  Laterality: N/A;   OVARIAN CYST REMOVAL     TONSILLECTOMY      Current Medications: Current Meds  Medication Sig   aspirin EC 81 MG tablet Take 81 mg by mouth daily. Swallow whole.   esomeprazole (NEXIUM) 20 MG capsule Take 20 mg by mouth every morning.   ferrous sulfate 325 (65 FE) MG tablet Take 325 mg by mouth every evening.   levothyroxine (SYNTHROID, LEVOTHROID) 100 MCG tablet  Take 100 mcg by mouth daily before breakfast.   melatonin 5 MG TABS Take 5 mg by mouth at bedtime. (Patient taking differently: Take 10 mg by mouth at bedtime.)   metFORMIN (GLUCOPHAGE-XR) 500 MG 24 hr tablet Take 1,000 mg by mouth 2 (two) times daily with a meal.   Multiple Vitamins-Minerals (MULTIVITAMIN WOMEN PO) Take 1 tablet by mouth daily.     Allergies:   Patient has no known allergies.   Social History   Socioeconomic History   Marital status: Married    Spouse name: Not on file   Number of children: 1   Years of education: Not on file   Highest education level: Not on file  Occupational History   Not on file  Tobacco Use   Smoking status: Former   Smokeless tobacco: Never  Substance and Sexual Activity   Alcohol use: Not on file   Drug use: Not on file   Sexual activity: Not on file  Other Topics Concern   Not on file  Social History Narrative   Retired Marine scientist   Social Drivers of Corporate investment banker Strain: Not on file  Food Insecurity: Not on file  Transportation Needs: Not on file  Physical Activity: Not on file  Stress: Not  on file  Social Connections: Not on file     Family History: The patient's family history includes Autoimmune disease in her sister; Cancer in her brother and father; Colitis in her sister; Dementia in her mother; Heart disease in her father; Leukemia in her father.  ROS:   Please see the history of present illness.     All other systems reviewed and are negative.  EKGs/Labs/Other Studies Reviewed:    The following studies were reviewed today:  EKG Interpretation Date/Time:  Monday February 06 2024 09:41:36 EDT Ventricular Rate:  95 PR Interval:  174 QRS Duration:  78 QT Interval:  350 QTC Calculation: 439 R Axis:   18  Text Interpretation: Sinus rhythm with occasional Premature ventricular complexes Possible Left atrial enlargement Poor R wave progression No previous ECGs available Confirmed by [place], Staley Lunz  [phone]) on 02/06/2024 9:48:26 AM    Recent Labs: No results found for requested labs within last 365 days.  Recent Lipid Panel No results found for: CHOL, TRIG, HDL, CHOLHDL, VLDL, LDLCALC, LDLDIRECT Dated 08/30/22: CBC normal. Glucose 112 otherwise CMET normal. A1c 5.6%.  Dated 02/11/23: cholesterol 222, triglycerides 64, HDL 99, LDL 112. TSH 1.1.  Dateed 09/01/23: TSH 0.601. A1c 6.2%. Hgb 10.3. plts 466K. CMET normal. Cholesterol 192, triglycerides 54, HDL 78, LDL 104.   Risk Assessment/Calculations:    CHA2DS2-VASc Score = 2   This indicates a 2.2% annual risk of stroke. The patient's score is based upon: CHF History: 0 HTN History: 0 Diabetes History: 0 Stroke History: 0 Vascular Disease History: 0 Age Score: 1 Gender Score: 1                Physical Exam:    VS:  BP 100/84 (BP Location: Left Arm, Patient Position: Sitting, Cuff Size: Normal)   Pulse 95   Ht 5' 7.5 (1.715 m)   Wt 190 lb (86.2 kg)   SpO2 96%   BMI 29.32 kg/m     Wt Readings from Last 3 Encounters:  02/06/24 190 lb (86.2 kg)     GEN:  Well nourished, well developed in no acute distress HEENT: Normal NECK: No JVD; No carotid bruits LYMPHATICS: No lymphadenopathy CARDIAC: RRR, no murmurs, rubs, gallops RESPIRATORY:  Clear to auscultation without rales, wheezing or rhonchi  ABDOMEN: Soft, non-tender, non-distended MUSCULOSKELETAL:  No edema; No deformity  SKIN: Warm and dry NEUROLOGIC:  Alert and oriented x 3 PSYCHIATRIC:  Normal affect   ASSESSMENT:    1. Pre-operative clearance   2. Paroxysmal atrial fibrillation (HCC)   3. Iron deficiency anemia, unspecified iron deficiency anemia type   4. Periesophageal hiatal hernia    PLAN:    In order of problems listed above:  Dyspnea on exertion and fatigue. I think this is due to her large periesophageal hernia and anemia. Echo was normal. On CT she has no coronary or aortic calcification making risk for CAD low. I think she is a  suitable candidate for surgical repair of her hernia without further cardiac testing Atrial fibrillation paroxysmal. This may be impacted by her hernia as well. Now in NSR. Will continue low dose metoprolol . I would like to reassess burden of Afib after she has healed from surgery to help decide need for anticoagulation, ablation or AAD therapy. Will plan follow up in 3 months post op and consider event monitor at that time. Also discussed other ways to monitor rhythm including Apple watch or Kardiomobile device. If she does have Afib on follow up would recommend  anticoagulation rather than ASA. If bleeding is an ongoing concern could consider a Watchman device. Iron deficiency anemia.            Medication Adjustments/Labs and Tests Ordered: Current medicines are reviewed at length with the patient today.  Concerns regarding medicines are outlined above.  Orders Placed This Encounter  Procedures   EKG 12-Lead   Meds ordered this encounter  Medications   metoprolol  succinate (TOPROL -XL) 25 MG 24 hr tablet    Sig: Take 0.5 tablets (12.5 mg total) by mouth daily.    Dispense:  45 tablet    Refill:  3    Patient Instructions  Medication Instructions:  Continue same medications  Lab Work: None ordered  Testing/Procedures: None ordered  Follow-Up: At [facility], you and your health needs are our priority.  As part of our continuing mission to provide you with exceptional heart care, our providers are all part of one team.  This team includes your primary Cardiologist (physician) and Advanced Practice Providers or APPs (Physician Assistants and Nurse Practitioners) who all work together to provide you with the care you need, when you need it.  Your next appointment:  Monday 11/3 at 10:20 am    Provider:  Dr.Adriane Gabbert    We recommend signing up for the patient portal called MyChart.  Sign up information is provided on this After Visit Summary.  MyChart is used to connect  with patients for Virtual Visits (Telemedicine).  Patients are able to view lab/test results, encounter notes, upcoming appointments, etc.  Non-urgent messages can be sent to your provider as well.   To learn more about what you can do with MyChart, go to [email or web address].       Signed, Atul Delucia Swaziland, MD  02/06/2024 10:15 AM    [facility]

## 2024-02-06 ENCOUNTER — Encounter: Payer: Self-pay | Admitting: Cardiology

## 2024-02-06 ENCOUNTER — Ambulatory Visit: Attending: Cardiology | Admitting: Cardiology

## 2024-02-06 VITALS — BP 100/84 | HR 95 | Ht 67.5 in | Wt 190.0 lb

## 2024-02-06 DIAGNOSIS — Z01818 Encounter for other preprocedural examination: Secondary | ICD-10-CM

## 2024-02-06 DIAGNOSIS — I480 Paroxysmal atrial fibrillation: Secondary | ICD-10-CM

## 2024-02-06 DIAGNOSIS — D509 Iron deficiency anemia, unspecified: Secondary | ICD-10-CM | POA: Diagnosis not present

## 2024-02-06 DIAGNOSIS — K449 Diaphragmatic hernia without obstruction or gangrene: Secondary | ICD-10-CM | POA: Diagnosis not present

## 2024-02-06 MED ORDER — METOPROLOL SUCCINATE ER 25 MG PO TB24: 12.5000 mg | ORAL_TABLET | Freq: Every day | ORAL | 3 refills | Status: AC

## 2024-02-06 NOTE — Patient Instructions (Signed)
 Medication Instructions:  Continue same medications  Lab Work: None ordered  Testing/Procedures: None ordered  Follow-Up: At [facility], you and your health needs are our priority.  As part of our continuing mission to provide you with exceptional heart care, our providers are all part of one team.  This team includes your primary Cardiologist (physician) and Advanced Practice Providers or APPs (Physician Assistants and Nurse Practitioners) who all work together to provide you with the care you need, when you need it.  Your next appointment:  Monday 11/3 at 10:20 am    Provider:  Dr.Jordan    We recommend signing up for the patient portal called MyChart.  Sign up information is provided on this After Visit Summary.  MyChart is used to connect with patients for Virtual Visits (Telemedicine).  Patients are able to view lab/test results, encounter notes, upcoming appointments, etc.  Non-urgent messages can be sent to your provider as well.   To learn more about what you can do with MyChart, go to [email or web address].

## 2024-02-15 DIAGNOSIS — M8580 Other specified disorders of bone density and structure, unspecified site: Secondary | ICD-10-CM | POA: Diagnosis not present

## 2024-02-15 DIAGNOSIS — E039 Hypothyroidism, unspecified: Secondary | ICD-10-CM | POA: Diagnosis not present

## 2024-02-27 DIAGNOSIS — E039 Hypothyroidism, unspecified: Secondary | ICD-10-CM | POA: Diagnosis not present

## 2024-02-27 DIAGNOSIS — E7800 Pure hypercholesterolemia, unspecified: Secondary | ICD-10-CM | POA: Diagnosis not present

## 2024-02-27 DIAGNOSIS — D519 Vitamin B12 deficiency anemia, unspecified: Secondary | ICD-10-CM | POA: Diagnosis not present

## 2024-02-27 DIAGNOSIS — E559 Vitamin D deficiency, unspecified: Secondary | ICD-10-CM | POA: Diagnosis not present

## 2024-02-27 DIAGNOSIS — M8580 Other specified disorders of bone density and structure, unspecified site: Secondary | ICD-10-CM | POA: Diagnosis not present

## 2024-02-27 DIAGNOSIS — R7301 Impaired fasting glucose: Secondary | ICD-10-CM | POA: Diagnosis not present

## 2024-02-27 DIAGNOSIS — R5383 Other fatigue: Secondary | ICD-10-CM | POA: Diagnosis not present

## 2024-02-27 DIAGNOSIS — Z79899 Other long term (current) drug therapy: Secondary | ICD-10-CM | POA: Diagnosis not present

## 2024-02-27 DIAGNOSIS — Z23 Encounter for immunization: Secondary | ICD-10-CM | POA: Diagnosis not present

## 2024-03-06 ENCOUNTER — Ambulatory Visit: Payer: Self-pay | Admitting: General Surgery

## 2024-03-16 NOTE — Patient Instructions (Addendum)
 SURGICAL WAITING ROOM VISITATION Patients having surgery or a procedure may have no more than 2 support people in the waiting area - these visitors may rotate in the visitor waiting room.   If the patient needs to stay at the hospital during part of their recovery, the visitor guidelines for inpatient rooms apply.  PRE-OP VISITATION  Pre-op nurse will coordinate an appropriate time for 1 support person to accompany the patient in pre-op.  This support person may not rotate.  This visitor will be contacted when the time is appropriate for the visitor to come back in the pre-op area.  Please refer to the [facility] website for the visitor guidelines for Inpatients (after your surgery is over and you are in a regular room).  You are not required to quarantine at this time prior to your surgery. However, you must do this: Hand Hygiene often Do NOT share personal items Notify your provider if you are in close contact with someone who has COVID or you develop fever 100.4 or greater, new onset of sneezing, cough, sore throat, shortness of breath or body aches.  If you test positive for Covid or have been in contact with anyone that has tested positive in the last 10 days please notify you surgeon.    Your procedure is scheduled on:  Monday  March 25, 2024  Report to [facility] Main Entrance: [address] entrance where the [employer] is available.   Report to admitting at: 08:15    AM  Call this number if you have any questions or problems the morning of surgery [phone]  FOLLOW ANY ADDITIONAL PRE OP INSTRUCTIONS YOU RECEIVED FROM YOUR SURGEON'S OFFICE!!!  Do not eat food after Midnight the night prior to your surgery/procedure.  After Midnight you may have the following liquids until  07:30 AM DAY OF SURGERY  Clear Liquid Diet Water Black Coffee (sugar ok, NO MILK/CREAM OR CREAMERS)  Tea (sugar ok, NO MILK/CREAM OR CREAMERS) regular and decaf                              Plain Jell-O  with no fruit (NO RED)                                           Fruit ices (not with fruit pulp, NO RED)                                     Popsicles (NO RED)                                                                  Juice: NO CITRUS JUICES: only apple, WHITE grape, WHITE cranberry Sports drinks like Gatorade or Powerade (NO RED)                   The day of surgery:  Drink ONE (1) Pre-Surgery G2 at  7:30  AM the morning of surgery. Drink in one sitting. Do not sip.  This drink was given to  you during your hospital pre-op appointment visit. Nothing else to drink after completing the Pre-Surgery Clear Ensure or G2 : No candy, chewing gum or throat lozenges.     Oral Hygiene is also important to reduce your risk of infection.        Remember - BRUSH YOUR TEETH THE MORNING OF SURGERY WITH YOUR REGULAR TOOTHPASTE  Do NOT smoke after Midnight the night before surgery.  STOP TAKING all Vitamins, Herbs and supplements 1 week before your surgery.   ASPIRIN- Stop taking as per your instructions by the Surgeon.  METFORMIN- Day BEFORE surgery, take as usual.                  DO NOT TAKE METFORMIN THE DAY OF YOUR SURGERY.   Take ONLY these medicines the morning of surgery with A SIP OF WATER: esomeprazole, levothyroxine, Metoprolol .                    You may not have any metal on your body including hair pins, jewelry, and body piercing  Do not wear make-up, lotions, powders, perfumes or deodorant  Do not wear nail polish including gel and S&S, artificial / acrylic nails, or any other type of covering on natural nails including finger and toenails. If you have artificial nails, gel coating, etc., that needs to be removed by a nail salon, Please have this removed prior to surgery. Not doing so may mean that your surgery could be cancelled or delayed if the Surgeon or anesthesia staff feels like they are unable to monitor you safely.   Do not shave 48 hours prior to  surgery to avoid nicks in your skin which may contribute to postoperative infections.   Contacts, Hearing Aids, dentures or bridgework may not be worn into surgery. DENTURES WILL BE REMOVED PRIOR TO SURGERY PLEASE DO NOT APPLY Poly grip OR ADHESIVES!!!  You may bring a small overnight bag with you on the day of surgery, only pack items that are not valuable. [facility] IS NOT RESPONSIBLE   FOR VALUABLES THAT ARE LOST OR STOLEN.   Do not bring your home medications to the hospital. The Pharmacy will dispense medications listed on your medication list to you during your admission in the Hospital.  Please read over the following fact sheets you were given: IF YOU HAVE QUESTIONS ABOUT YOUR PRE-OP INSTRUCTIONS, PLEASE CALL [phone]   [facility] - Preparing for Surgery Before surgery, you can play an important role.  Because skin is not sterile, your skin needs to be as free of germs as possible.  You can reduce the number of germs on your skin by washing with CHG (chlorahexidine gluconate) soap before surgery.  CHG is an antiseptic cleaner which kills germs and bonds with the skin to continue killing germs even after washing. Please DO NOT use if you have an allergy to CHG or antibacterial soaps.  If your skin becomes reddened/irritated stop using the CHG and inform your nurse when you arrive at Short Stay. Do not shave (including legs and underarms) for at least 48 hours prior to the first CHG shower.  You may shave your face/neck.  Please follow these instructions carefully:  1.  Shower with CHG Soap the night before surgery ONLY  (DO NOT USE THE SOAP THE MORNING OF SURGERY).  2.  If you choose to wash your hair, wash your hair first as usual with your normal  shampoo.  3.  After you shampoo, rinse  your hair and body thoroughly to remove the shampoo.                             4.  Use CHG as you would any other liquid soap.  You can apply chg directly to the skin and wash.  Gently with a  scrungie or clean washcloth.  5.  Apply the CHG Soap to your body ONLY FROM THE NECK DOWN.   Do not use on face/ open                           Wound or open sores. Avoid contact with eyes, ears mouth and genitals (private parts).                       Wash face,  Genitals (private parts) with your normal soap.             6.  Wash thoroughly, paying special attention to the area where your  surgery  will be performed.  7.  Thoroughly rinse your body with warm water from the neck down.  8.  DO NOT shower/wash with your normal soap after using and rinsing off the CHG Soap.             9.  Pat yourself dry with a clean towel.            10.  Wear clean pajamas.            11.  Place clean sheets on your bed the night of your first shower and do not  sleep with pets.  Day of Surgery : Do not apply any CHG, lotions/deodorants the morning of surgery.  Please wear clean clothes to the hospital/surgery center.  FAILURE TO FOLLOW THESE INSTRUCTIONS MAY RESULT IN THE CANCELLATION OF YOUR SURGERY  PATIENT SIGNATURE_________________________________  NURSE SIGNATURE__________________________________  ________________________________________________________________________

## 2024-03-16 NOTE — Progress Notes (Signed)
 COVID Vaccine received:  []  No [x]  Yes Date of any COVID positive Test in last 90 days: none  PCP - Lonell Collet, DO  Cardiologist - Peter Swaziland, MD cardiac clearance in 02-06-24 Epic note Endocrinology- Littie Caffey, MD  Chest x-ray - CT chest  11-23-23 EKG -  02-06-24  Stress Test -  ECHO -  Cardiac Cath -  CT Coronary Calcium score:   Bowel Prep - [x]  No  []   Yes ______  Pacemaker / ICD device [x]  No []  Yes   Spinal Cord Stimulator:[x]  No []  Yes       History of Sleep Apnea? [x]  No []  Yes   CPAP used?- [x]  No []  Yes    Patient has: []  NO Hx DM   [x]  Pre-DM   []  DM1  []   DM2 Does the patient monitor blood sugar?   []  N/A   [x]  No []  Yes  Last A1c was: 6.0 on    02-15-2024  CEW Metformin- hold DOS   Blood Thinner / Instructions: none Aspirin Instructions:  ASA 81mg    Hold x 5 days per patient's instruction from MD.    Dental hx: []  Dentures:  []  N/A      []  Bridge or Partial: permanent                  []  Loose or Damaged teeth:   Activity level: Able to walk up 2 flights of stairs without becoming significantly short of breath or having chest pain?  [x]  No  SOB  []    Yes  Patient can perform ADLs without assistance. []  No   [x]   Yes  Anesthesia review: A.Fib, HTN, Pre-DM, anemia GERD  Patient denies any S&S of respiratory illness or Covid - no shortness of breath, fever, cough or chest pain at PAT appointment.  Patient verbalized understanding and agreement to the Pre-Surgical Instructions that were given to them at this PAT appointment. Patient was also educated of the need to review these PAT instructions again prior to her surgery.I reviewed the appropriate phone numbers to call if they have any and questions or concerns.

## 2024-03-19 ENCOUNTER — Other Ambulatory Visit: Payer: Self-pay

## 2024-03-19 ENCOUNTER — Encounter (HOSPITAL_COMMUNITY): Payer: Self-pay | Admitting: *Deleted

## 2024-03-19 ENCOUNTER — Encounter (HOSPITAL_COMMUNITY)
Admission: RE | Admit: 2024-03-19 | Discharge: 2024-03-19 | Disposition: A | Discharge status: Home / Self Care | Source: Ambulatory Visit | Attending: General Surgery | Admitting: General Surgery

## 2024-03-19 VITALS — BP 120/62 | HR 90 | Temp 99.3°F | Resp 12 | Ht 67.5 in | Wt 189.0 lb

## 2024-03-19 DIAGNOSIS — E039 Hypothyroidism, unspecified: Secondary | ICD-10-CM | POA: Diagnosis not present

## 2024-03-19 DIAGNOSIS — R7303 Prediabetes: Secondary | ICD-10-CM | POA: Diagnosis not present

## 2024-03-19 DIAGNOSIS — I480 Paroxysmal atrial fibrillation: Secondary | ICD-10-CM | POA: Diagnosis not present

## 2024-03-19 DIAGNOSIS — Z87891 Personal history of nicotine dependence: Secondary | ICD-10-CM | POA: Insufficient documentation

## 2024-03-19 DIAGNOSIS — Z01818 Encounter for other preprocedural examination: Secondary | ICD-10-CM | POA: Diagnosis present

## 2024-03-19 DIAGNOSIS — K219 Gastro-esophageal reflux disease without esophagitis: Secondary | ICD-10-CM | POA: Diagnosis not present

## 2024-03-19 DIAGNOSIS — I10 Essential (primary) hypertension: Secondary | ICD-10-CM | POA: Diagnosis not present

## 2024-03-19 DIAGNOSIS — Z7982 Long term (current) use of aspirin: Secondary | ICD-10-CM | POA: Insufficient documentation

## 2024-03-19 DIAGNOSIS — Z01812 Encounter for preprocedural laboratory examination: Secondary | ICD-10-CM | POA: Insufficient documentation

## 2024-03-19 DIAGNOSIS — Z7984 Long term (current) use of oral hypoglycemic drugs: Secondary | ICD-10-CM | POA: Diagnosis not present

## 2024-03-19 DIAGNOSIS — K449 Diaphragmatic hernia without obstruction or gangrene: Secondary | ICD-10-CM | POA: Insufficient documentation

## 2024-03-19 HISTORY — DX: Essential (primary) hypertension: I10

## 2024-03-19 HISTORY — DX: Malignant (primary) neoplasm, unspecified: C80.1

## 2024-03-19 HISTORY — DX: Unspecified osteoarthritis, unspecified site: M19.90

## 2024-03-19 HISTORY — DX: Personal history of other diseases of the digestive system: Z87.19

## 2024-03-19 HISTORY — DX: Prediabetes: R73.03

## 2024-03-19 HISTORY — DX: Pneumonia, unspecified organism: J18.9

## 2024-03-19 HISTORY — DX: Depression, unspecified: F32.A

## 2024-03-19 LAB — CBC
HCT: 47.5 % — ABNORMAL HIGH (ref 36.0–46.0)
Hemoglobin: 14.1 g/dL (ref 12.0–15.0)
MCH: 25.6 pg — ABNORMAL LOW (ref 26.0–34.0)
MCHC: 29.7 g/dL — ABNORMAL LOW (ref 30.0–36.0)
MCV: 86.4 fL (ref 80.0–100.0)
Platelets: 335 K/uL (ref 150–400)
RBC: 5.5 MIL/uL — ABNORMAL HIGH (ref 3.87–5.11)
RDW: 20.7 % — ABNORMAL HIGH (ref 11.5–15.5)
WBC: 9.5 K/uL (ref 4.0–10.5)
nRBC: 0 % (ref 0.0–0.2)

## 2024-03-19 LAB — BASIC METABOLIC PANEL WITH GFR
Anion gap: 11 (ref 5–15)
BUN: 17 mg/dL (ref 8–23)
CO2: 26 mmol/L (ref 22–32)
Calcium: 10.1 mg/dL (ref 8.9–10.3)
Chloride: 103 mmol/L (ref 98–111)
Creatinine, Ser: 0.77 mg/dL (ref 0.44–1.00)
GFR, Estimated: >60 mL/min (ref >60)
Glucose, Bld: 88 mg/dL (ref 70–99)
Potassium: 4.7 mmol/L (ref 3.5–5.1)
Sodium: 140 mmol/L (ref 135–145)

## 2024-03-20 NOTE — Anesthesia Preprocedure Evaluation (Addendum)
 Anesthesia Evaluation  Patient identified by MRN, date of birth, ID band Patient awake    Reviewed: Allergy & Precautions, NPO status , Patient's Chart, lab work & pertinent test results  History of Anesthesia Complications Negative for: history of anesthetic complications  Airway Mallampati: III  TM Distance: >3 FB Neck ROM: Full    Dental  (+) Dental Advisory Given Permanent bridge on top and bottom, crowns:   Pulmonary neg shortness of breath, neg sleep apnea, neg COPD, neg recent URI, former smoker   Pulmonary exam normal breath sounds clear to auscultation       Cardiovascular hypertension (metoprolol ), Pt. on home beta blockers (-) angina (-) Past MI, (-) Cardiac Stents and (-) CABG + dysrhythmias (PVCs) Atrial Fibrillation  Rhythm:Regular Rate:Normal     Neuro/Psych  PSYCHIATRIC DISORDERS  Depression    negative neurological ROS     GI/Hepatic Neg liver ROS, hiatal hernia,GERD  Medicated,,  Endo/Other  Hypothyroidism  Pre-diabetes, goiter  Renal/GU negative Renal ROS     Musculoskeletal  (+) Arthritis ,  Osteopenia    Abdominal   Peds  Hematology  (+) Blood dyscrasia, anemia Lab Results      Component                Value               Date                      WBC                      9.5                 03/19/2024                HGB                      14.1                03/19/2024                HCT                      47.5 (H)            03/19/2024                MCV                      86.4                03/19/2024                PLT                      335                 03/19/2024              Anesthesia Other Findings   Reproductive/Obstetrics                              Anesthesia Physical Anesthesia Plan  ASA: 3  Anesthesia Plan: General   Post-op Pain Management: Tylenol PO (pre-op)*   Induction: Intravenous  PONV Risk Score and Plan: 3 and  Ondansetron, Dexamethasone and Treatment may vary due to age or  medical condition  Airway Management Planned: Oral ETT  Additional Equipment:   Intra-op Plan:   Post-operative Plan: Extubation in OR  Informed Consent: I have reviewed the patients History and Physical, chart, labs and discussed the procedure including the risks, benefits and alternatives for the proposed anesthesia with the patient or authorized representative who has indicated his/her understanding and acceptance.     Dental advisory given  Plan Discussed with: CRNA and Anesthesiologist  Anesthesia Plan Comments: (See PAT note 03/19/2024  Risks of general anesthesia discussed including, but not limited to, sore throat, hoarse voice, chipped/damaged teeth, injury to vocal cords, nausea and vomiting, allergic reactions, lung infection, heart attack, stroke, and death. All questions answered. )         Anesthesia Quick Evaluation

## 2024-03-20 NOTE — Progress Notes (Signed)
 Anesthesia Chart Review   Case: [id number] Date/Time: 03/26/24 1015   Procedures:      REPAIR, HERNIA, HIATAL, ROBOT-ASSISTED - ROBOTIC HIATAL HERNIA REPAIR WITH PARTIAL FUNDOPLICATION     CREATION, GASTROSTOMY, LAPAROSCOPIC - LAPAROSCOPIC GASTROMY TUBE INSERTION   Anesthesia type: General   Diagnosis: Hiatal hernia [K44.9]   Pre-op diagnosis: HIATAL HERNIA   Location: [facility] ROOM 02 / [facility]   Surgeons: Kinsinger, Herlene Righter, MD       DISCUSSION:73 y.o. former smoker with h/o HTN, GERD, hypothyroidism, paroxysmal atrial fibrillation, hiatal hernia scheduled for above procedure 03/26/2024 with Dr. Herlene Bureau.   Pt seen by cardiology 02/06/24 for preoperative evaluation.  Per OV note, Dyspnea on exertion and fatigue. I think this is due to her large periesophageal hernia and anemia. Echo was normal. On CT she has no coronary or aortic calcification making risk for CAD low. I think she is a suitable candidate for surgical repair of her hernia without further cardiac testing  VS: BP 120/62 Comment: right arm sitting  Pulse 90   Temp 37.4 C (Oral)   Resp 12   Ht 5' 7.5 (1.715 m)   Wt 85.7 kg   LMP  (LMP Unknown)   SpO2 94%   BMI 29.16 kg/m   PROVIDERS: Gerome Brunet, DO is PCP    LABS: Labs reviewed: Acceptable for surgery. 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   Depression    Environmental allergies    GERD (gastroesophageal reflux disease)    Goiter    History of hiatal hernia    Hypertension    Hypothyroidism     IFG (impaired fasting glucose)    Osteopenia    Pneumonia    Pre-diabetes    SOB (shortness of breath)    Stress and adjustment reaction     Past Surgical History:  Procedure Laterality Date   GIVENS CAPSULE STUDY N/A 04/02/2022   Procedure: GIVENS CAPSULE STUDY;  Surgeon: Rollin Dover, MD;  Location: [facility] END[facility]COPY;  Service: Gastroenterology;  Laterality: N/A;   MOHS SURGERY Right 2022   wrist- squamous cell lesion   OVARIAN CYST REMOVAL  1980   THYROID  CYST EXCISION  1971   Benign   TONSILLECTOMY      MEDICATIONS:  aspirin EC 81 MG tablet   Calcium Carb-Cholecalciferol (CALCIUM + VITAMIN D3 PO)   Cholecalciferol (VITAMIN D3) 50 MCG (2000 UT) CAPS   esomeprazole (NEXIUM) 20 MG capsule   ferrous sulfate 325 (65 FE) MG tablet   levothyroxine (SYNTHROID, LEVOTHROID) 100 MCG tablet   Magnesium 250 MG TABS   Melatonin 10 MG TABS   metFORMIN (GLUCOPHAGE-XR) 500 MG 24 hr tablet   metoprolol  succinate (TOPROL -XL) 25 MG 24 hr tablet   Multiple Vitamins-Minerals (MULTIVITAMIN WOMEN PO)   No current facility-administered medications for this encounter.    Costas Sena Ward, PA-C  [facility] [phone]

## 2024-03-26 ENCOUNTER — Encounter (HOSPITAL_COMMUNITY): Admission: RE | Disposition: A | Payer: Self-pay | Source: Ambulatory Visit | Attending: General Surgery

## 2024-03-26 ENCOUNTER — Ambulatory Visit (HOSPITAL_COMMUNITY): Payer: Self-pay | Admitting: Anesthesiology

## 2024-03-26 ENCOUNTER — Ambulatory Visit (HOSPITAL_COMMUNITY): Payer: Self-pay | Admitting: Medical

## 2024-03-26 ENCOUNTER — Encounter (HOSPITAL_COMMUNITY): Payer: Self-pay | Admitting: General Surgery

## 2024-03-26 ENCOUNTER — Other Ambulatory Visit: Payer: Self-pay

## 2024-03-26 ENCOUNTER — Observation Stay (HOSPITAL_COMMUNITY)
Admission: RE | Admit: 2024-03-26 | Discharge: 2024-03-29 | Disposition: A | Discharge status: Home / Self Care | Source: Ambulatory Visit | Attending: General Surgery | Admitting: General Surgery

## 2024-03-26 DIAGNOSIS — D649 Anemia, unspecified: Secondary | ICD-10-CM | POA: Diagnosis not present

## 2024-03-26 DIAGNOSIS — R0602 Shortness of breath: Secondary | ICD-10-CM | POA: Diagnosis present

## 2024-03-26 DIAGNOSIS — K449 Diaphragmatic hernia without obstruction or gangrene: Secondary | ICD-10-CM

## 2024-03-26 DIAGNOSIS — Z87891 Personal history of nicotine dependence: Secondary | ICD-10-CM | POA: Diagnosis not present

## 2024-03-26 DIAGNOSIS — Z79899 Other long term (current) drug therapy: Secondary | ICD-10-CM | POA: Insufficient documentation

## 2024-03-26 DIAGNOSIS — E039 Hypothyroidism, unspecified: Secondary | ICD-10-CM | POA: Diagnosis not present

## 2024-03-26 DIAGNOSIS — Z7982 Long term (current) use of aspirin: Secondary | ICD-10-CM | POA: Diagnosis not present

## 2024-03-26 DIAGNOSIS — I10 Essential (primary) hypertension: Secondary | ICD-10-CM | POA: Diagnosis not present

## 2024-03-26 DIAGNOSIS — I4891 Unspecified atrial fibrillation: Secondary | ICD-10-CM | POA: Diagnosis not present

## 2024-03-26 HISTORY — PX: LAPAROSCOPIC GASTROSTOMY: SHX5896

## 2024-03-26 HISTORY — PX: XI ROBOTIC ASSISTED HIATAL HERNIA REPAIR: SHX6889

## 2024-03-26 LAB — CBC
HCT: 43.4 % (ref 36.0–46.0)
Hemoglobin: 13.2 g/dL (ref 12.0–15.0)
MCH: 26.2 pg (ref 26.0–34.0)
MCHC: 30.4 g/dL (ref 30.0–36.0)
MCV: 86.3 fL (ref 80.0–100.0)
Platelets: 248 K/uL (ref 150–400)
RBC: 5.03 MIL/uL (ref 3.87–5.11)
RDW: 19.1 % — ABNORMAL HIGH (ref 11.5–15.5)
WBC: 17 K/uL — ABNORMAL HIGH (ref 4.0–10.5)
nRBC: 0 % (ref 0.0–0.2)

## 2024-03-26 LAB — CREATININE, SERUM
Creatinine, Ser: 0.63 mg/dL (ref 0.44–1.00)
GFR, Estimated: >60 mL/min (ref >60)

## 2024-03-26 LAB — GLUCOSE, CAPILLARY: Glucose-Capillary: 114 mg/dL — ABNORMAL HIGH (ref 70–99)

## 2024-03-26 SURGERY — REPAIR, HERNIA, HIATAL, ROBOT-ASSISTED
Anesthesia: General

## 2024-03-26 MED ORDER — FENTANYL CITRATE (PF) 50 MCG/ML IJ SOSY
25.0000 ug | PREFILLED_SYRINGE | INTRAMUSCULAR | Status: DC | PRN
  Administered 2024-03-26 (×2): 25 ug via INTRAVENOUS

## 2024-03-26 MED ORDER — PROPOFOL 10 MG/ML IV BOLUS
INTRAVENOUS | Status: AC
Start: 2024-03-26 — End: 2024-03-26
  Filled 2024-03-26: qty 20

## 2024-03-26 MED ORDER — ALBUMIN HUMAN 5 % IV SOLN
INTRAVENOUS | Status: AC
Start: 2024-03-26 — End: 2024-03-26
  Filled 2024-03-26: qty 250

## 2024-03-26 MED ORDER — FENTANYL CITRATE (PF) 100 MCG/2ML IJ SOLN
INTRAMUSCULAR | Status: AC
  Filled 2024-03-26: qty 2

## 2024-03-26 MED ORDER — OXYCODONE HCL 5 MG/5ML PO SOLN: 5.0000 mg | Freq: Once | ORAL | Status: DC | PRN

## 2024-03-26 MED ORDER — ROCURONIUM BROMIDE 10 MG/ML (PF) SYRINGE
PREFILLED_SYRINGE | INTRAVENOUS | Status: DC | PRN
  Administered 2024-03-26: 50 mg via INTRAVENOUS
  Administered 2024-03-26 (×3): 20 mg via INTRAVENOUS
  Administered 2024-03-26: 10 mg via INTRAVENOUS

## 2024-03-26 MED ORDER — OXYCODONE HCL 5 MG PO TABS
5.0000 mg | ORAL_TABLET | Freq: Four times a day (QID) | ORAL | Status: DC | PRN
  Administered 2024-03-26 – 2024-03-29 (×5): 5 mg via ORAL
  Filled 2024-03-26 (×5): qty 1

## 2024-03-26 MED ORDER — SUGAMMADEX SODIUM 200 MG/2ML IV SOLN
INTRAVENOUS | Status: AC
  Filled 2024-03-26: qty 2

## 2024-03-26 MED ORDER — ACETAMINOPHEN 500 MG PO TABS
1000.0000 mg | ORAL_TABLET | ORAL | Status: AC
  Administered 2024-03-26: 1000 mg via ORAL
  Filled 2024-03-26: qty 2

## 2024-03-26 MED ORDER — MELATONIN 5 MG PO TABS
10.0000 mg | ORAL_TABLET | Freq: Every day | ORAL | Status: DC
  Administered 2024-03-26 – 2024-03-28 (×3): 10 mg via ORAL
  Filled 2024-03-26 (×3): qty 2

## 2024-03-26 MED ORDER — PROPOFOL 10 MG/ML IV BOLUS
INTRAVENOUS | Status: DC | PRN
  Administered 2024-03-26: 150 mg via INTRAVENOUS

## 2024-03-26 MED ORDER — LIDOCAINE HCL (PF) 2 % IJ SOLN
INTRAMUSCULAR | Status: AC
  Filled 2024-03-26: qty 5

## 2024-03-26 MED ORDER — METOPROLOL SUCCINATE ER 25 MG PO TB24
12.5000 mg | ORAL_TABLET | Freq: Every day | ORAL | Status: DC
  Administered 2024-03-26 – 2024-03-28 (×3): 12.5 mg via ORAL
  Filled 2024-03-26 (×3): qty 1

## 2024-03-26 MED ORDER — PHENYLEPHRINE HCL-NACL 20-0.9 MG/250ML-% IV SOLN
INTRAVENOUS | Status: DC | PRN
  Administered 2024-03-26: 50 ug/min via INTRAVENOUS

## 2024-03-26 MED ORDER — FENTANYL CITRATE (PF) 100 MCG/2ML IJ SOLN
INTRAMUSCULAR | Status: DC | PRN
  Administered 2024-03-26: 50 ug via INTRAVENOUS
  Administered 2024-03-26: 100 ug via INTRAVENOUS
  Administered 2024-03-26: 50 ug via INTRAVENOUS

## 2024-03-26 MED ORDER — PHENYLEPHRINE 80 MCG/ML (10ML) SYRINGE FOR IV PUSH (FOR BLOOD PRESSURE SUPPORT)
PREFILLED_SYRINGE | INTRAVENOUS | Status: AC
  Filled 2024-03-26: qty 10

## 2024-03-26 MED ORDER — CHLORHEXIDINE GLUCONATE CLOTH 2 % EX PADS: 6.0000 | MEDICATED_PAD | Freq: Once | CUTANEOUS | Status: DC

## 2024-03-26 MED ORDER — 0.9 % SODIUM CHLORIDE (POUR BTL) OPTIME
TOPICAL | Status: DC | PRN
  Administered 2024-03-26: 1000 mL

## 2024-03-26 MED ORDER — ENOXAPARIN SODIUM 40 MG/0.4ML IJ SOSY
40.0000 mg | PREFILLED_SYRINGE | INTRAMUSCULAR | Status: DC
Start: 2024-03-27 — End: 2024-03-29
  Administered 2024-03-27 – 2024-03-29 (×3): 40 mg via SUBCUTANEOUS
  Filled 2024-03-26 (×3): qty 0.4

## 2024-03-26 MED ORDER — LACTATED RINGERS IV SOLN: INTRAVENOUS | Status: DC

## 2024-03-26 MED ORDER — BUPIVACAINE-EPINEPHRINE 0.25% -1:200000 IJ SOLN
INTRAMUSCULAR | Status: DC | PRN
  Administered 2024-03-26: 30 mL

## 2024-03-26 MED ORDER — CELECOXIB 200 MG PO CAPS
400.0000 mg | ORAL_CAPSULE | ORAL | Status: AC
  Administered 2024-03-26: 400 mg via ORAL
  Filled 2024-03-26: qty 2

## 2024-03-26 MED ORDER — AMISULPRIDE (ANTIEMETIC) 5 MG/2ML IV SOLN
INTRAVENOUS | Status: AC
  Filled 2024-03-26: qty 4

## 2024-03-26 MED ORDER — LEVOTHYROXINE SODIUM 100 MCG PO TABS
100.0000 ug | ORAL_TABLET | Freq: Every day | ORAL | Status: DC
  Administered 2024-03-27 – 2024-03-29 (×3): 100 ug via ORAL
  Filled 2024-03-26 (×3): qty 1

## 2024-03-26 MED ORDER — OXYCODONE HCL 5 MG PO TABS: 5.0000 mg | ORAL_TABLET | Freq: Once | ORAL | Status: DC | PRN

## 2024-03-26 MED ORDER — CHLORHEXIDINE GLUCONATE 0.12 % MT SOLN
15.0000 mL | Freq: Once | OROMUCOSAL | Status: AC
  Administered 2024-03-26: 15 mL via OROMUCOSAL

## 2024-03-26 MED ORDER — PANTOPRAZOLE SODIUM 40 MG IV SOLR
40.0000 mg | Freq: Every day | INTRAVENOUS | Status: DC
  Administered 2024-03-26 – 2024-03-27 (×2): 40 mg via INTRAVENOUS
  Filled 2024-03-26 (×2): qty 10

## 2024-03-26 MED ORDER — ORAL CARE MOUTH RINSE: 15.0000 mL | Freq: Once | OROMUCOSAL | Status: AC

## 2024-03-26 MED ORDER — PHENYLEPHRINE 80 MCG/ML (10ML) SYRINGE FOR IV PUSH (FOR BLOOD PRESSURE SUPPORT)
PREFILLED_SYRINGE | INTRAVENOUS | Status: DC | PRN
  Administered 2024-03-26 (×2): 160 ug via INTRAVENOUS

## 2024-03-26 MED ORDER — DEXTROSE-SODIUM CHLORIDE 5-0.45 % IV SOLN: INTRAVENOUS | Status: AC

## 2024-03-26 MED ORDER — TRAMADOL HCL 50 MG PO TABS
50.0000 mg | ORAL_TABLET | Freq: Four times a day (QID) | ORAL | Status: DC | PRN
  Administered 2024-03-26 – 2024-03-28 (×3): 50 mg via ORAL
  Filled 2024-03-26 (×3): qty 1

## 2024-03-26 MED ORDER — ROCURONIUM BROMIDE 10 MG/ML (PF) SYRINGE
PREFILLED_SYRINGE | INTRAVENOUS | Status: AC
  Filled 2024-03-26: qty 10

## 2024-03-26 MED ORDER — STERILE WATER FOR IRRIGATION IR SOLN
Status: DC | PRN
  Administered 2024-03-26: 1000 mL

## 2024-03-26 MED ORDER — DIPHENHYDRAMINE HCL 12.5 MG/5ML PO ELIX: 12.5000 mg | ORAL_SOLUTION | Freq: Four times a day (QID) | ORAL | Status: DC | PRN

## 2024-03-26 MED ORDER — HYDROMORPHONE HCL 1 MG/ML IJ SOLN: 0.5000 mg | INTRAMUSCULAR | Status: DC | PRN

## 2024-03-26 MED ORDER — DEXAMETHASONE SOD PHOSPHATE PF 10 MG/ML IJ SOLN
INTRAMUSCULAR | Status: DC | PRN
  Administered 2024-03-26: 10 mg via INTRAVENOUS

## 2024-03-26 MED ORDER — CEFAZOLIN SODIUM-DEXTROSE 2-4 GM/100ML-% IV SOLN
2.0000 g | INTRAVENOUS | Status: AC
Start: 2024-03-26 — End: 2024-03-26
  Administered 2024-03-26: 2 g via INTRAVENOUS
  Filled 2024-03-26: qty 100

## 2024-03-26 MED ORDER — FENTANYL CITRATE (PF) 50 MCG/ML IJ SOSY
PREFILLED_SYRINGE | INTRAMUSCULAR | Status: AC
  Filled 2024-03-26: qty 1

## 2024-03-26 MED ORDER — ENSURE PRE-SURGERY PO LIQD: 296.0000 mL | Freq: Once | ORAL | Status: DC

## 2024-03-26 MED ORDER — AMISULPRIDE (ANTIEMETIC) 5 MG/2ML IV SOLN
10.0000 mg | Freq: Once | INTRAVENOUS | Status: AC | PRN
  Administered 2024-03-26: 10 mg via INTRAVENOUS

## 2024-03-26 MED ORDER — ONDANSETRON HCL 4 MG/2ML IJ SOLN
INTRAMUSCULAR | Status: AC
  Filled 2024-03-26: qty 2

## 2024-03-26 MED ORDER — SIMETHICONE 80 MG PO CHEW: 40.0000 mg | CHEWABLE_TABLET | Freq: Four times a day (QID) | ORAL | Status: DC | PRN

## 2024-03-26 MED ORDER — ONDANSETRON HCL 4 MG/2ML IJ SOLN
INTRAMUSCULAR | Status: DC | PRN
  Administered 2024-03-26: 4 mg via INTRAVENOUS

## 2024-03-26 MED ORDER — DIPHENHYDRAMINE HCL 50 MG/ML IJ SOLN: 12.5000 mg | Freq: Four times a day (QID) | INTRAMUSCULAR | Status: DC | PRN

## 2024-03-26 MED ORDER — LIDOCAINE HCL (CARDIAC) PF 100 MG/5ML IV SOSY
PREFILLED_SYRINGE | INTRAVENOUS | Status: DC | PRN
Start: 2024-03-26 — End: 2024-03-26
  Administered 2024-03-26: 100 mg via INTRAVENOUS

## 2024-03-26 MED ORDER — ONDANSETRON 4 MG PO TBDP: 4.0000 mg | ORAL_TABLET | Freq: Four times a day (QID) | ORAL | Status: DC | PRN

## 2024-03-26 MED ORDER — ONDANSETRON HCL 4 MG/2ML IJ SOLN
4.0000 mg | Freq: Four times a day (QID) | INTRAMUSCULAR | Status: DC | PRN
  Administered 2024-03-26 – 2024-03-28 (×3): 4 mg via INTRAVENOUS
  Filled 2024-03-26 (×3): qty 2

## 2024-03-26 MED ORDER — ALBUMIN HUMAN 5 % IV SOLN
INTRAVENOUS | Status: DC | PRN
Start: 2024-03-26 — End: 2024-03-26

## 2024-03-26 MED ORDER — ENSURE SURGERY PO LIQD
237.0000 mL | Freq: Two times a day (BID) | ORAL | Status: DC
  Administered 2024-03-27 – 2024-03-28 (×4): 237 mL via ORAL

## 2024-03-26 MED ORDER — BUPIVACAINE-EPINEPHRINE (PF) 0.25% -1:200000 IJ SOLN
INTRAMUSCULAR | Status: AC
Start: 2024-03-26 — End: 2024-03-26
  Filled 2024-03-26: qty 60

## 2024-03-26 MED ORDER — SUGAMMADEX SODIUM 200 MG/2ML IV SOLN
INTRAVENOUS | Status: DC | PRN
  Administered 2024-03-26: 200 mg via INTRAVENOUS

## 2024-03-26 SURGICAL SUPPLY — 66 items
BAG COUNTER SPONGE SURGICOUNT (BAG) ×1 IMPLANT
BLADE SURG SZ11 CARB STEEL (BLADE) ×1 IMPLANT
CABLE HIGH FREQUENCY MONO STRZ (ELECTRODE) ×1 IMPLANT
CHLORAPREP W/TINT 26 (MISCELLANEOUS) ×1 IMPLANT
COVER SURGICAL LIGHT HANDLE (MISCELLANEOUS) ×1 IMPLANT
COVER TIP SHEARS 8 DVNC (MISCELLANEOUS) IMPLANT
DERMABOND ADVANCED .7 DNX12 (GAUZE/BANDAGES/DRESSINGS) ×1 IMPLANT
DRAIN CHANNEL 19F RND (DRAIN) IMPLANT
DRAIN PENROSE 0.5X18 (DRAIN) IMPLANT
DRAPE ARM DVNC X/XI (DISPOSABLE) ×4 IMPLANT
DRAPE COLUMN DVNC XI (DISPOSABLE) ×1 IMPLANT
DRAPE SURG ORHT 6 SPLT 77X108 (DRAPES) ×1 IMPLANT
DRIVER NDL LRG 8 DVNC XI (INSTRUMENTS) ×1 IMPLANT
DRIVER NDL MEGA SUTCUT DVNCXI (INSTRUMENTS) ×1 IMPLANT
DRIVER NDLE LRG 8 DVNC XI (INSTRUMENTS) IMPLANT
DRIVER NDLE MEGA SUTCUT DVNCXI (INSTRUMENTS) ×1 IMPLANT
ELECT REM PT RETURN 15FT ADLT (MISCELLANEOUS) ×1 IMPLANT
EVACUATOR SILICONE 100CC (DRAIN) IMPLANT
FORCEPS CADIERE DVNC XI (FORCEP) ×1 IMPLANT
G-TUBE MIC 18FR ENFIT ADLT (TUBING) IMPLANT
G-TUBE MIC BOLUS 18FR ENFIT (TUBING) IMPLANT
GAUZE 4X4 16PLY ~~LOC~~+RFID DBL (SPONGE) ×1 IMPLANT
GAUZE SPONGE 4X4 12PLY STRL (GAUZE/BANDAGES/DRESSINGS) IMPLANT
GLOVE BIOGEL PI IND STRL 7.0 (GLOVE) ×2 IMPLANT
GLOVE SURG SS PI 7.0 STRL IVOR (GLOVE) ×2 IMPLANT
GOWN STRL REUS W/ TWL LRG LVL3 (GOWN DISPOSABLE) ×2 IMPLANT
GRASPER SUT TROCAR 14GX15 (MISCELLANEOUS) ×1 IMPLANT
GRASPER TIP-UP FEN DVNC XI (INSTRUMENTS) ×1 IMPLANT
IRRIGATION SUCT STRKRFLW 2 WTP (MISCELLANEOUS) ×1 IMPLANT
KIT BASIN OR (CUSTOM PROCEDURE TRAY) ×1 IMPLANT
KIT INTRO ENDO 22F F/GAST TUBE (SET/KITS/TRAYS/PACK) IMPLANT
KIT TURNOVER KIT A (KITS) ×1 IMPLANT
LUBRICANT JELLY K Y 4OZ (MISCELLANEOUS) IMPLANT
MARKER SKIN DUAL TIP RULER LAB (MISCELLANEOUS) ×1 IMPLANT
MESH BIO-A 7X10 SYN MAT (Mesh General) IMPLANT
NDL HYPO 22X1.5 SAFETY MO (MISCELLANEOUS) ×1 IMPLANT
NEEDLE HYPO 22X1.5 SAFETY MO (MISCELLANEOUS) ×1 IMPLANT
PACK CARDIOVASCULAR III (CUSTOM PROCEDURE TRAY) ×1 IMPLANT
PENCIL SMOKE EVACUATOR (MISCELLANEOUS) IMPLANT
SCISSORS LAP 5X35 DISP (ENDOMECHANICALS) ×1 IMPLANT
SCISSORS MNPLR CVD DVNC XI (INSTRUMENTS) ×1 IMPLANT
SEAL UNIV 5-12 XI (MISCELLANEOUS) ×3 IMPLANT
SEALER VESSEL EXT DVNC XI (MISCELLANEOUS) ×1 IMPLANT
SET TUBE SMOKE EVAC HIGH FLOW (TUBING) ×1 IMPLANT
SHEARS HARMONIC 36 ACE (MISCELLANEOUS) IMPLANT
SLEEVE Z-THREAD 5X100MM (TROCAR) ×2 IMPLANT
SOLUTION ANTFG W/FOAM PAD STRL (MISCELLANEOUS) ×1 IMPLANT
SOLUTION ELECTROSURG ANTI STCK (MISCELLANEOUS) IMPLANT
SPIKE FLUID TRANSFER (MISCELLANEOUS) ×1 IMPLANT
SPONGE DRAIN TRACH 4X4 STRL 2S (GAUZE/BANDAGES/DRESSINGS) ×1 IMPLANT
SUT ETHIBOND 0 36 GRN (SUTURE) ×2 IMPLANT
SUT ETHILON 2 0 PS N (SUTURE) IMPLANT
SUT MNCRL AB 4-0 PS2 18 (SUTURE) ×1 IMPLANT
SUT SILK 0 SH 30 (SUTURE) IMPLANT
SUT SILK 2 0 SH (SUTURE) ×3 IMPLANT
SUT SILK 3 0 SH 30 (SUTURE) IMPLANT
SYR 20ML LL LF (SYRINGE) ×2 IMPLANT
TIP INNERVISION DETACH 40FR (MISCELLANEOUS) IMPLANT
TIP INNERVISION DETACH 50FR (MISCELLANEOUS) IMPLANT
TIP INNERVISION DETACH 56FR (MISCELLANEOUS) IMPLANT
TOWEL GREEN STERILE FF (TOWEL DISPOSABLE) ×1 IMPLANT
TOWEL OR 17X26 10 PK STRL BLUE (TOWEL DISPOSABLE) ×1 IMPLANT
TRAY FOLEY MTR SLVR 16FR STAT (SET/KITS/TRAYS/PACK) IMPLANT
TRAY LAPAROSCOPIC (CUSTOM PROCEDURE TRAY) ×1 IMPLANT
TROCAR Z-THREAD BLADED 11X100M (TROCAR) ×1 IMPLANT
TROCAR Z-THREAD OPTICAL 5X100M (TROCAR) ×1 IMPLANT

## 2024-03-26 NOTE — Anesthesia Postprocedure Evaluation (Signed)
 Anesthesia Post Note  Patient: Kristin Bishop  Procedure(s) Performed: REPAIR, HERNIA, HIATAL, ROBOT-ASSISTED CREATION, GASTROSTOMY, LAPAROSCOPIC     Patient location during evaluation: PACU Anesthesia Type: General Level of consciousness: awake Pain management: pain level controlled Vital Signs Assessment: post-procedure vital signs reviewed and stable Respiratory status: spontaneous breathing, nonlabored ventilation and respiratory function stable Cardiovascular status: blood pressure returned to baseline and stable Postop Assessment: no apparent nausea or vomiting Anesthetic complications: no   No notable events documented.  Last Vitals:  Vitals:   03/26/24 1600 03/26/24 1615  BP: (!) 152/82   Pulse: 68 66  Resp: 14 20  Temp:    SpO2: 91% 93%    Last Pain:  Vitals:   03/26/24 1545  TempSrc:   PainSc: Asleep                 Delon Aisha Arch

## 2024-03-26 NOTE — Transfer of Care (Signed)
 Immediate Anesthesia Transfer of Care Note  Patient: Kristin Bishop  Procedure(s) Performed: REPAIR, HERNIA, HIATAL, ROBOT-ASSISTED CREATION, GASTROSTOMY, LAPAROSCOPIC  Patient Location: PACU  Anesthesia Type:General  Level of Consciousness: drowsy  Airway & Oxygen Therapy: Patient Spontanous Breathing and Patient connected to face mask oxygen  Post-op Assessment: Report given to RN and Post -op Vital signs reviewed and stable  Post vital signs: Reviewed and stable  Last Vitals:  Vitals Value Taken Time  BP    Temp    Pulse 77 03/26/24 14:23  Resp 16 03/26/24 14:23  SpO2 91 % 03/26/24 14:23  Vitals shown include unfiled device data.  Last Pain:  Vitals:   03/26/24 0851  TempSrc: Oral  PainSc: 0-No pain         Complications: No notable events documented.

## 2024-03-26 NOTE — Anesthesia Procedure Notes (Signed)
 Procedure Name: Intubation Date/Time: 03/26/2024 11:50 AM  Performed by: Vincenzo Show, CRNAPre-anesthesia Checklist: Patient identified, Emergency Drugs available, Suction available, Patient being monitored and Timeout performed Patient Re-evaluated:Patient Re-evaluated prior to induction Oxygen Delivery Method: Circle system utilized Preoxygenation: Pre-oxygenation with 100% oxygen Induction Type: IV induction Ventilation: Mask ventilation without difficulty Laryngoscope Size: Mac and 3 Grade View: Grade II Tube type: Oral Tube size: 7.5 mm Number of attempts: 1 Airway Equipment and Method: Stylet Placement Confirmation: ETT inserted through vocal cords under direct vision, positive ETCO2, CO2 detector and breath sounds checked- equal and bilateral Secured at: 21 cm Tube secured with: Tape Dental Injury: Teeth and Oropharynx as per pre-operative assessment  Comments: ATOI

## 2024-03-26 NOTE — Op Note (Signed)
 Preoperative diagnosis: hiatal hernia, anemia  Postoperative diagnosis: same   Procedure: robotic paraesophageal hernia repair, laparoscopic gastrostomy tube creation  Surgeon: Herlene Bureau, M.D.  Asst: Camellia Blush, M.D.  Anesthesia: general  Indications for procedure: Kristin Bishop is a 73 y.o. year old female with symptoms of anemia, reflux and early satiety presents for hiatal hernia repair.  Description of procedure: The patient was brought into the operative suite. Anesthesia was administered with General endotracheal anesthesia. WHO checklist was applied. The patient was then placed in supine position. The area was prepped and draped in the usual sterile fashion.  Next, a right subcostal abdominal incision was made. A 5 mm trocar was used to gain access to the peritoneal cavity by optical entry technique. Pneumoperitoneum was applied with a high flow and low pressure. The laparoscope was reinserted to confirm position. An 8 mm trocar was placed in the left mid abdomen and an 8 mm trocar was placed in the left lateral abdomen. An 8 mm trocar was place in the left periumbilical space. An 8 mm trocar was placed in the right mid abdomen.  Bilateral TAP blocks were placed with Marcaine.  A Nathanson retractor was placed in the subxiphoid space and used to retract the left lobe of the liver.  The hiatal hernia appeared large and contained all the stomach. The pars flaccida was divided with harmonic scalpel The peritoneum of the right crus was divided and the sac separated from the chest contents. This plane was continued anteriorly and to the left crus. Next, the posterior area was dissected free. Additional care was used to dissect attachments to the chest to the sac and esophagus to improve mobility. A penrose was placed around the GE junction for visualization and retraction. The esophagus was completely freed from surrounding attachments. Care was taken to avoid injury to the vagus nerves.  The sac was divided from the GE junction and removed.  After full mobilization the esophagus was still shortened as only 1-2 cm was in the abdomen, therefore no fundoplication was performed.  The crus was repaired with 4 interrupted 2-0 ethibond sutures. The anterior portion of the hiatus was quite wide. The ligament of the crus to liver was dissected for mobility. Due to anterior orientation, One 2-0 ethibond was used to suture the anterior crus to the right crus leaving the esophagus laterally. This showed appropriate sizing of the hiatus around the esophagus.  Next, a BioA 7x10 cm mesh was introduced and sutured to the diaphragm with 2-0 ethibond to reinforce the repair.  G tube: Next, a 2-0 silk was used to make a pursestring in the distal body of the anterior stomach. The robot was undocked. A left upper quadrant incision was made. An 18 fr g tube was introduced into the abdomen. A gastrotomy was made with cautery in the center of the pursestring area. The g tube was easily introduced. The pursestring was tied down. 2 0 silks were used for gastropexy superior and inferior to the g tube and sutured to the fascia with suture passer. The g tube was tightened to 3 cm at the skin.   Hemostasis was inspected and intact. Pneumoperitoneum was removed. All trocars were removed. All incisions were closed with 4-0 monocryl subcuticular suture. Dermabond was placed for dressing. The patient awoke from anesthesia and was brought to pacu in stable condition.   Findings: giant hiatal hernia  Specimen: none  Implant: 18 Fr G tube   Blood loss: 30 ml  Local anesthesia: 30  ml Marcaine  Complications: none  Herlene Bureau, M.D. General, Bariatric, & Minimally Invasive Surgery [facility]

## 2024-03-26 NOTE — Plan of Care (Signed)
  Problem: Health Behavior/Discharge Planning: Goal: Ability to manage health-related needs will improve 03/26/2024 1611 by Noella Hands, RN Outcome: Progressing 03/26/2024 1610 by Noella Hands, RN Outcome: Progressing   Problem: Clinical Measurements: Goal: Ability to maintain clinical measurements within normal limits will improve Outcome: Progressing   Problem: Elimination: Goal: Will not experience complications related to bowel motility Outcome: Progressing   Problem: Pain Managment: Goal: General experience of comfort will improve and/or be controlled 03/26/2024 1611 by Noella Hands, RN Outcome: Progressing 03/26/2024 1610 by Noella Hands, RN Outcome: Progressing   Problem: Skin Integrity: Goal: Risk for impaired skin integrity will decrease Outcome: Progressing

## 2024-03-26 NOTE — H&P (Signed)
 Subjective   Kristin Bishop is a 73 y.o. female new patient in today for: History of Present Illness [name] is a 73 year old female with a large hiatal hernia and atrial fibrillation who presents with digestive discomfort and shortness of breath.   She experiences significant digestive discomfort with early satiety, necessitating forced burping or vomiting to relieve fullness. A CT scan revealed a large hiatal hernia. She has been on Nexium for reflux management for 15-20 years.   She experiences shortness of breath, which she attributes to the hiatal hernia and atrial fibrillation. She has stopped consuming alcohol due to atrial fibrillation and is on metoprolol  and aspirin, with a pending cardiology appointment.   She was diagnosed with anemia nearly two years ago, which improved with dietary changes but has since worsened. She can no longer practice yoga due to dizziness and shortness of breath when her head is below her heart, and has adapted by walking and swimming for exercise.   Social Drivers of Health with Concerns        Tobacco Use: Medium Risk (04/14/2023)    Received from [facility]    Patient History     Smoking Tobacco Use: Former     Smokeless Tobacco Use: Never         * No data to display             No outpatient medications prior to visit.    No facility-administered medications prior to visit.          Objective   There were no vitals filed for this visit. There is no height or weight on file to calculate BMI. Physical Exam    I reviewed CT chest images showing a very large hiatal hernia containing a stomach with some twist such that the esophagus enter inferior to the body of the stomach. I reviewed notes by Lonell Collet showing anemia and atrial fibrillation   Assessment/Plan:    Assessment & Plan Hiatal hernia with risk of obstruction Large hiatal hernia with stomach above diaphragm, twisted, increasing obstruction risk. Possible anemia  contribution. - Schedule surgical repair post-cardiology evaluation. - We discussed the etiology and symptoms of hiatal hernias.  We discussed possible future issues of worsened reflux, early satiety, Cameron's ulcer, and volvulus. We went over surgical options of laparoscopic reduction of the hiatal hernia with identification of the esophagus and stomach, movable of the sac, ensuring appropriate esophageal length into the abdomen, repairing the diaphragm crura, possible placement of mesh, partial fundoplication and other fundoplication options, and possible need for gastrostomy tube.  We discussed risks of bleeding, infection, pneumonia, injury to esophagus, injury to stomach, injury to other abdominal or thoracic organs, trouble swallowing, gas bloat, and need for additional surgery.  All questions were answered.  Patient decided proceed with robotic hiatal hernia repair with partial fundoplication with g tube insertion with possible mesh insertion with observation stay. - Advise liquid diet for two weeks post-surgery, then gradual diet advancement over two months. - Avoid heavy lifting for three weeks post-surgery.   Anemia, unspecified Anemia may relate to hiatal hernia, with hemoglobin fluctuations. Hernia repair may address anemia if due to gastrointestinal blood loss or malabsorption.   Atrial fibrillation Recently diagnosed, managed with metoprolol  and aspirin. Cardiology input needed for anticoagulation management pre-surgery. - Continue metoprolol  and aspirin. - Attend cardiology appointment in August for anticoagulation evaluation. - Coordinate with cardiologist on anticoagulation management pre-surgery.   Diagnoses and all orders for this visit:  Paraesophageal hernia   Paroxysmal atrial fibrillation (CMS/HHS-HCC)   Anemia, unspecified type

## 2024-03-27 ENCOUNTER — Encounter (HOSPITAL_COMMUNITY): Payer: Self-pay | Admitting: General Surgery

## 2024-03-27 ENCOUNTER — Observation Stay (HOSPITAL_COMMUNITY)

## 2024-03-27 DIAGNOSIS — R0989 Other specified symptoms and signs involving the circulatory and respiratory systems: Secondary | ICD-10-CM | POA: Diagnosis not present

## 2024-03-27 DIAGNOSIS — Z87891 Personal history of nicotine dependence: Secondary | ICD-10-CM | POA: Diagnosis not present

## 2024-03-27 DIAGNOSIS — R0902 Hypoxemia: Secondary | ICD-10-CM | POA: Diagnosis not present

## 2024-03-27 DIAGNOSIS — D649 Anemia, unspecified: Secondary | ICD-10-CM | POA: Diagnosis not present

## 2024-03-27 DIAGNOSIS — E039 Hypothyroidism, unspecified: Secondary | ICD-10-CM | POA: Diagnosis not present

## 2024-03-27 DIAGNOSIS — R918 Other nonspecific abnormal finding of lung field: Secondary | ICD-10-CM | POA: Diagnosis not present

## 2024-03-27 DIAGNOSIS — K449 Diaphragmatic hernia without obstruction or gangrene: Secondary | ICD-10-CM | POA: Diagnosis not present

## 2024-03-27 DIAGNOSIS — Z7982 Long term (current) use of aspirin: Secondary | ICD-10-CM | POA: Diagnosis not present

## 2024-03-27 DIAGNOSIS — I10 Essential (primary) hypertension: Secondary | ICD-10-CM | POA: Diagnosis not present

## 2024-03-27 DIAGNOSIS — Z79899 Other long term (current) drug therapy: Secondary | ICD-10-CM | POA: Diagnosis not present

## 2024-03-27 DIAGNOSIS — I4891 Unspecified atrial fibrillation: Secondary | ICD-10-CM | POA: Diagnosis not present

## 2024-03-27 LAB — BASIC METABOLIC PANEL WITH GFR
Anion gap: 11 (ref 5–15)
BUN: 8 mg/dL (ref 8–23)
CO2: 25 mmol/L (ref 22–32)
Calcium: 9 mg/dL (ref 8.9–10.3)
Chloride: 101 mmol/L (ref 98–111)
Creatinine, Ser: 0.64 mg/dL (ref 0.44–1.00)
GFR, Estimated: >60 mL/min (ref >60)
Glucose, Bld: 118 mg/dL — ABNORMAL HIGH (ref 70–99)
Potassium: 4.8 mmol/L (ref 3.5–5.1)
Sodium: 138 mmol/L (ref 135–145)

## 2024-03-27 LAB — CBC
HCT: 41.7 % (ref 36.0–46.0)
Hemoglobin: 12.6 g/dL (ref 12.0–15.0)
MCH: 26.4 pg (ref 26.0–34.0)
MCHC: 30.2 g/dL (ref 30.0–36.0)
MCV: 87.2 fL (ref 80.0–100.0)
Platelets: 257 K/uL (ref 150–400)
RBC: 4.78 MIL/uL (ref 3.87–5.11)
RDW: 19.3 % — ABNORMAL HIGH (ref 11.5–15.5)
WBC: 13.2 K/uL — ABNORMAL HIGH (ref 4.0–10.5)
nRBC: 0 % (ref 0.0–0.2)

## 2024-03-27 NOTE — TOC Initial Note (Signed)
 Transition of Care [facility]) - Initial/Assessment Note    Patient Details  Name: Kristin Bishop MRN: [id number] Date of Birth: 1951-05-21  Transition of Care [facility]) CM/SW Contact:    [name], RN Phone Number: 03/27/2024, 3:09 PM  Clinical Narrative:     Met with patient at bedside to introduce role of TOC/NCM and review for dc planning, patient confirmed she has an established PCP, no current home care services or home DME. Patient reports she resides alone, at baseline she is independent with self care and functional mobility. MOON completed. TOC will continue to follow.               Expected Discharge Plan: Home/Self Care Barriers to Discharge: Continued Medical Work up   Patient Goals and CMS Choice Patient states their goals for this hospitalization and ongoing recovery are:: return home          Expected Discharge Plan and Services       Living arrangements for the past 2 months: Single Family Home                                      Prior Living Arrangements/Services Living arrangements for the past 2 months: Single Family Home Lives with:: Self Patient language and need for interpreter reviewed:: Yes Do you feel safe going back to the place where you live?: Yes      Need for Family Participation in Patient Care: Yes (Comment) Care giver support system in place?: Yes (comment)      Activities of Daily Living   ADL Screening (condition at time of admission) Independently performs ADLs?: Yes (appropriate for developmental age) Is the patient deaf or have difficulty hearing?: No Does the patient have difficulty seeing, even when wearing glasses/contacts?: No Does the patient have difficulty concentrating, remembering, or making decisions?: No  Permission Sought/Granted                  Emotional Assessment Appearance:: Appears stated age Attitude/Demeanor/Rapport: Engaged Affect (typically observed): Accepting Orientation: : Oriented to  Self, Oriented to Place, Oriented to  Time, Oriented to Situation Alcohol / Substance Use: Not Applicable Psych Involvement: No (comment)  Admission diagnosis:  Hiatal hernia [K44.9] Patient Active Problem List   Diagnosis Date Noted   Hiatal hernia 03/26/2024   Pain in left knee 07/27/2017   PCP:  Gerome Brunet, DO Pharmacy:   [facility] [place], [place] - [address] [facility] [address] [place] [address] Phone: [phone] Fax: [phone]     Social Drivers of Health (SDOH) Social History: SDOH Screenings   Food Insecurity: No Food Insecurity (03/26/2024)  Housing: Low Risk  (03/26/2024)  Transportation Needs: No Transportation Needs (03/26/2024)  Utilities: Not At Risk (03/26/2024)  Social Connections: Moderately Integrated (03/26/2024)  Tobacco Use: Medium Risk (03/26/2024)   SDOH Interventions:     Readmission Risk Interventions     No data to display

## 2024-03-27 NOTE — Progress Notes (Signed)
  1 Day Post-Op   Chief Complaint/Subjective: O2 dropped when sleeping, pain in neck worse than abdomen, some nausea, tolerated some liquids  Objective: Vital signs in last 24 hours: Temp:  [97.8 F (36.6 C)-99.2 F (37.3 C)] 98.2 F (36.8 C) (10/14 0927) Pulse Rate:  [56-78] 56 (10/14 0927) Resp:  [12-20] 16 (10/14 0927) BP: (95-152)/(65-88) 121/65 (10/14 0927) SpO2:  [81 %-97 %] 90 % (10/14 0952) Last BM Date : 03/26/24 Intake/Output from previous day: 10/13 0701 - 10/14 0700 In: 2371.1 [P.O.:300; I.V.:1721.1; IV Piggyback:350] Out: 35 [Blood:35]  PE: Gen: NAd Resp: nonlabored Card: bradycardic Abd: incisions c/d/I, drain in place  Lab Results:  Recent Labs    03/26/24 1842 03/27/24 0507  WBC 17.0* 13.2*  HGB 13.2 12.6  HCT 43.4 41.7  PLT 248 257   Recent Labs    03/26/24 1842 03/27/24 0507  NA  --  138  K  --  4.8  CL  --  101  CO2  --  25  GLUCOSE  --  118*  BUN  --  8  CREATININE 0.63 0.64  CALCIUM  --  9.0   No results for input(s): LABPROT, INR in the last 72 hours.    Component Value Date/Time   NA 138 03/27/2024 0507   K 4.8 03/27/2024 0507   CL 101 03/27/2024 0507   CO2 25 03/27/2024 0507   GLUCOSE 118 (H) 03/27/2024 0507   BUN 8 03/27/2024 0507   CREATININE 0.64 03/27/2024 0507   CALCIUM 9.0 03/27/2024 0507   GFRNONAA >60 03/27/2024 0507    Assessment/Plan  s/p Procedure(s): REPAIR, HERNIA, HIATAL, ROBOT-ASSISTED CREATION, GASTROSTOMY, LAPAROSCOPIC 03/26/2024    FEN - full liquids VTE - lovenox ID - periop abx complete Disposition - working on O2 level, ambulate   LOS: 0 days   I reviewed last 24 h vitals and pain scores, last 48 h intake and output, last 24 h labs and trends, and last 24 h imaging results.  This care required moderate level of medical decision making.   Kristin Bishop [facility] 03/27/2024, 10:35 AM Please see Amion for pager number during day hours 7:00am-4:30pm or  7:00am -11:30am on weekends

## 2024-03-27 NOTE — Care Management Important Message (Incomplete)
 Important Message  Patient Details  Name: Kristin Bishop MRN: [id number] Date of Birth: 07/17/1950   Important Message Given:        [name], RN 03/27/2024, 1:50 PM

## 2024-03-27 NOTE — Care Management Obs Status (Signed)
 MEDICARE OBSERVATION STATUS NOTIFICATION   Patient Details  Name: Kristin Bishop MRN: [id number] Date of Birth: Oct 31, 1950   Medicare Observation Status Notification Given:  Yes    [name], RN 03/27/2024, 1:52 PM

## 2024-03-28 ENCOUNTER — Other Ambulatory Visit (HOSPITAL_COMMUNITY): Payer: Self-pay

## 2024-03-28 DIAGNOSIS — I10 Essential (primary) hypertension: Secondary | ICD-10-CM | POA: Diagnosis not present

## 2024-03-28 DIAGNOSIS — I4891 Unspecified atrial fibrillation: Secondary | ICD-10-CM | POA: Diagnosis not present

## 2024-03-28 DIAGNOSIS — E039 Hypothyroidism, unspecified: Secondary | ICD-10-CM | POA: Diagnosis not present

## 2024-03-28 DIAGNOSIS — Z7982 Long term (current) use of aspirin: Secondary | ICD-10-CM | POA: Diagnosis not present

## 2024-03-28 DIAGNOSIS — Z79899 Other long term (current) drug therapy: Secondary | ICD-10-CM | POA: Diagnosis not present

## 2024-03-28 DIAGNOSIS — Z87891 Personal history of nicotine dependence: Secondary | ICD-10-CM | POA: Diagnosis not present

## 2024-03-28 DIAGNOSIS — D649 Anemia, unspecified: Secondary | ICD-10-CM | POA: Diagnosis not present

## 2024-03-28 DIAGNOSIS — K449 Diaphragmatic hernia without obstruction or gangrene: Secondary | ICD-10-CM | POA: Diagnosis not present

## 2024-03-28 MED ORDER — PANTOPRAZOLE SODIUM 40 MG PO TBEC
40.0000 mg | DELAYED_RELEASE_TABLET | Freq: Every day | ORAL | Status: DC
  Administered 2024-03-28: 40 mg via ORAL
  Filled 2024-03-28: qty 1

## 2024-03-28 MED ORDER — FUROSEMIDE 10 MG/ML IJ SOLN
40.0000 mg | Freq: Once | INTRAMUSCULAR | Status: AC
  Administered 2024-03-28: 40 mg via INTRAVENOUS
  Filled 2024-03-28: qty 4

## 2024-03-28 MED ORDER — ONDANSETRON 4 MG PO TBDP
4.0000 mg | ORAL_TABLET | Freq: Four times a day (QID) | ORAL | 0 refills | Status: AC | PRN
  Filled 2024-03-28: qty 20, 5d supply, fill #0

## 2024-03-28 MED ORDER — TRAMADOL HCL 50 MG PO TABS
50.0000 mg | ORAL_TABLET | Freq: Three times a day (TID) | ORAL | 0 refills | Status: DC | PRN
  Filled 2024-03-28: qty 8, 3d supply, fill #0

## 2024-03-28 NOTE — Plan of Care (Signed)
   Problem: Education: Goal: Knowledge of General Education information will improve Description Including pain rating scale, medication(s)/side effects and non-pharmacologic comfort measures Outcome: Progressing   Problem: Health Behavior/Discharge Planning: Goal: Ability to manage health-related needs will improve Outcome: Progressing

## 2024-03-29 ENCOUNTER — Other Ambulatory Visit (HOSPITAL_COMMUNITY): Payer: Self-pay

## 2024-03-29 DIAGNOSIS — I10 Essential (primary) hypertension: Secondary | ICD-10-CM | POA: Diagnosis not present

## 2024-03-29 DIAGNOSIS — I4891 Unspecified atrial fibrillation: Secondary | ICD-10-CM | POA: Diagnosis not present

## 2024-03-29 DIAGNOSIS — D649 Anemia, unspecified: Secondary | ICD-10-CM | POA: Diagnosis not present

## 2024-03-29 DIAGNOSIS — Z79899 Other long term (current) drug therapy: Secondary | ICD-10-CM | POA: Diagnosis not present

## 2024-03-29 DIAGNOSIS — Z87891 Personal history of nicotine dependence: Secondary | ICD-10-CM | POA: Diagnosis not present

## 2024-03-29 DIAGNOSIS — K449 Diaphragmatic hernia without obstruction or gangrene: Secondary | ICD-10-CM | POA: Diagnosis not present

## 2024-03-29 DIAGNOSIS — E039 Hypothyroidism, unspecified: Secondary | ICD-10-CM | POA: Diagnosis not present

## 2024-03-29 DIAGNOSIS — Z7982 Long term (current) use of aspirin: Secondary | ICD-10-CM | POA: Diagnosis not present

## 2024-03-29 LAB — BASIC METABOLIC PANEL WITH GFR
Anion gap: 10 (ref 5–15)
BUN: 13 mg/dL (ref 8–23)
CO2: 30 mmol/L (ref 22–32)
Calcium: 9.1 mg/dL (ref 8.9–10.3)
Chloride: 98 mmol/L (ref 98–111)
Creatinine, Ser: 0.63 mg/dL (ref 0.44–1.00)
GFR, Estimated: >60 mL/min (ref >60)
Glucose, Bld: 122 mg/dL — ABNORMAL HIGH (ref 70–99)
Potassium: 3.9 mmol/L (ref 3.5–5.1)
Sodium: 138 mmol/L (ref 135–145)

## 2024-03-29 NOTE — Discharge Summary (Signed)
 Physician Discharge Summary  Kristin Bishop FMW:[id number] DOB: 1950/07/15 DOA: 03/26/2024  PCP: Gerome Brunet, DO  Admit date: 03/26/2024 Discharge date:  03/29/2024   Recommendations for Outpatient Follow-up:   (include homehealth, outpatient follow-up instructions, specific recommendations for PCP to follow-up on, etc.)   Follow-up Information     Kartik Fernando, Herlene Righter, MD Follow up on 04/25/2024.   Specialty: General Surgery Contact information: [address] [place] [address] [phone]                Discharge Diagnoses:  Principal Problem:   Hiatal hernia   Surgical Procedure: robotic hiatal hernia repair with g tube and mesh  Discharge Condition: Good Disposition: Home  Diet recommendation: full liquid diet for 2 weeks   Hospital Course:  73 yo female underwent large hiatal hernia repair. Post op she was admitted to the surgical floor. She had oxygen requirements and had pulmonary edema that responded to lasix. She improved and was discharged home POD 3.  Discharge Instructions  Discharge Instructions     Diet full liquid   Complete by: As directed    Advance to soft diet in 2 weeks   Discharge wound care:   Complete by: As directed    Shower normal tomorrow. Glue to stay on for 10-14 days. Clamp drain   Increase activity slowly   Complete by: As directed       Allergies as of 03/29/2024   No Known Allergies      Medication List     TAKE these medications    aspirin EC 81 MG tablet Take 81 mg by mouth daily. Swallow whole.   CALCIUM + VITAMIN D3 PO Take 1 tablet by mouth daily.   esomeprazole 20 MG capsule Commonly known as: NEXIUM Take 20 mg by mouth every morning.   ferrous sulfate 325 (65 FE) MG tablet Take 325 mg by mouth every evening.   levothyroxine 100 MCG tablet Commonly known as: SYNTHROID Take 100 mcg by mouth daily before breakfast.   Magnesium 250 MG Tabs Take 250 mg by mouth daily.    Melatonin 10 MG Tabs Take 10 mg by mouth at bedtime.   metFORMIN 500 MG 24 hr tablet Commonly known as: GLUCOPHAGE-XR Take 1,000 mg by mouth 2 (two) times daily with a meal.   metoprolol  succinate 25 MG 24 hr tablet Commonly known as: TOPROL -XL Take 0.5 tablets (12.5 mg total) by mouth daily.   MULTIVITAMIN WOMEN PO Take 1 tablet by mouth daily.   ondansetron 4 MG disintegrating tablet Commonly known as: ZOFRAN-ODT Dissolve 1 tablet (4 mg total) under tounge every 6 (six) hours as needed for nausea or vomiting.   traMADol 50 MG tablet Commonly known as: ULTRAM Take 1 tablet (50 mg total) by mouth every 8 (eight) hours as needed for up to 5 days.   vitamin D3 50 MCG (2000 UT) Caps Take 2,000 Units by mouth daily.               Discharge Care Instructions  (From admission, onward)           Start     Ordered   03/29/24 0000  Discharge wound care:       Comments: Shower normal tomorrow. Glue to stay on for 10-14 days. Clamp drain   03/29/24 0841            Follow-up Information     Arbor Leer, Herlene Righter, MD Follow up on 04/25/2024.  Specialty: General Surgery Contact information: [address] [place] [address] [phone]                  The results of significant diagnostics from this hospitalization (including imaging, microbiology, ancillary and laboratory) are listed below for reference.    Significant Diagnostic Studies: DG CHEST PORT 1 VIEW Result Date: 03/27/2024 CLINICAL DATA:  Hypoxia EXAM: PORTABLE CHEST 1 VIEW COMPARISON:  CT chest dated 11/23/2023 FINDINGS: Low lung volumes with bronchovascular crowding. Bibasilar patchy opacities. Dense left retrocardiac opacity. Blunting of bilateral costophrenic angles. No pneumothorax. Enlarged cardiomediastinal silhouette. No acute osseous abnormality. IMPRESSION: 1. Low lung volumes with bronchovascular crowding. Bibasilar patchy opacities and dense left retrocardiac  opacity, which may represent atelectasis, aspiration, or pneumonia. 2. Blunting of bilateral costophrenic angles, which may represent small pleural effusions. Electronically Signed   By: Limin  Xu M.D.   On: 03/27/2024 15:17    Labs: Basic Metabolic Panel: Recent Labs  Lab 03/26/24 1842 03/27/24 0507 03/29/24 0457  NA  --  138 138  K  --  4.8 3.9  CL  --  101 98  CO2  --  25 30  GLUCOSE  --  118* 122*  BUN  --  8 13  CREATININE 0.63 0.64 0.63  CALCIUM  --  9.0 9.1   Liver Function Tests: No results for input(s): AST, ALT, ALKPHOS, BILITOT, PROT, ALBUMIN in the last 168 hours.  CBC: Recent Labs  Lab 03/26/24 1842 03/27/24 0507  WBC 17.0* 13.2*  HGB 13.2 12.6  HCT 43.4 41.7  MCV 86.3 87.2  PLT 248 257    CBG: Recent Labs  Lab 03/26/24 0832  GLUCAP 114*    Principal Problem:   Hiatal hernia   Time coordinating discharge: 15 min

## 2024-03-29 NOTE — Progress Notes (Signed)
 Patient was given discharge instructions, and all questions were answered.  Patient was stable for discharge and was taken to the main exit by wheelchair.

## 2024-03-29 NOTE — Progress Notes (Signed)
 03/29/2024 9:50 AM -----------------------------------------------------------[facility]--------------------------------------------------- D(Data) A(Action) R(response)     Data: Discharge Readiness Assessment EDD today 03/29/2024    Action: Chart reviewed    Response: No immediate Barriers to discharge identified at this time.     Grace Haggart, RN [employer] [facility]

## 2024-03-29 NOTE — Progress Notes (Signed)
 Discharge medications delivered to patient at the bedside.

## 2024-03-29 NOTE — Progress Notes (Signed)
 S: tolerating liquids, neck pain persistent, unable to get off oxygen O: BP 122/81 (BP Location: Right Arm)   Pulse 98   Temp 98.1 F (36.7 C) (Oral)   Resp 16   Ht 5' 7.5 (1.715 m)   Wt 85.3 kg   LMP  (LMP Unknown)   SpO2 93%   BMI 29.01 kg/m  Gen: NAD Neuro: AOx4 Abd: soft incisions c/d/i  A/P POD 2 robotic hiatal hernia repair, Xr with pulm edema  -lasix today -continue full liquids

## 2024-03-30 ENCOUNTER — Other Ambulatory Visit (HOSPITAL_COMMUNITY): Payer: Self-pay

## 2024-04-02 NOTE — Progress Notes (Signed)
 Cardiology Office Note:    Date:  04/16/2024   ID:  Kristin Bishop, Kristin Bishop [birth year], MRN [id number]  PCP:  Gerome Brunet, DO   [facility] Providers Cardiologist:  None     Referring MD: Gerome Brunet, DO   Chief Complaint  Patient presents with   Atrial Fibrillation    History of Present Illness:    Kristin Bishop is a 73 y.o. female is seen for follow up  evaluation of SOB, Afib. She notes that in March she was found to be in Afib with rate of 120. Not really aware other than the fact that she feels anxious at times and this may last for hours. She was placed on ASA and low dose metoprolol . Echo was done by PCP and reported as normal. She also has been diagnosed with a large hiatal hernia extending into the thorax. She has intermittent iron deficiency anemia. She does complain of SOB and fatigue. Also complains of a lot of digestive issues and has to eat small amounts. No chest pain.   She did undergo robotic repair of a large hiatal hernia on 10/13. She states she still doesn't feel great. Reports she had Afib in hospital but records do not mention this and she was not on continual monitor.   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   Depression    Environmental allergies    GERD (gastroesophageal reflux disease)    Goiter    History of hiatal hernia    Hypertension    Hypothyroidism    IFG (impaired fasting glucose)    Osteopenia    Paroxysmal atrial fibrillation (HCC) 04/16/2024   Pneumonia    Pre-diabetes    SOB (shortness of breath)    Stress and adjustment reaction     Past Surgical History:  Procedure Laterality Date   GIVENS CAPSULE STUDY N/A 04/02/2022   Procedure: GIVENS CAPSULE STUDY;  Surgeon: Rollin Dover, MD;  Location: [facility] ENDOSCOPY;  Service: Gastroenterology;  Laterality: N/A;   LAPAROSCOPIC GASTROSTOMY N/A 03/26/2024   Procedure: CREATION,  GASTROSTOMY, LAPAROSCOPIC;  Surgeon: Kinsinger, Herlene Righter, MD;  Location: [facility] ORS;  Service: General;  Laterality: N/A;  LAPAROSCOPIC GASTROMY TUBE INSERTION   MOHS SURGERY Right 2022   wrist- squamous cell lesion   OVARIAN CYST REMOVAL  1980   THYROID  CYST EXCISION  1971   Benign   TONSILLECTOMY     XI ROBOTIC ASSISTED HIATAL HERNIA REPAIR N/A 03/26/2024   Procedure: REPAIR, HERNIA, HIATAL, ROBOT-ASSISTED;  Surgeon: Stevie Herlene Righter, MD;  Location: [facility] ORS;  Service: General;  Laterality: N/A;  ROBOTIC HIATAL HERNIA REPAIR WITH PARTIAL FUNDOPLICATION    Current Medications: Current Meds  Medication Sig   aspirin EC 81 MG tablet Take 81 mg by mouth daily. Swallow whole.   ferrous sulfate 325 (65 FE) MG tablet Take 325 mg by mouth every evening.   levothyroxine (SYNTHROID, LEVOTHROID) 100 MCG tablet Take 100 mcg by mouth daily before breakfast.   Magnesium 250 MG TABS Take 250 mg by mouth daily.   Melatonin 10 MG TABS Take 10 mg by mouth at bedtime.   metFORMIN (GLUCOPHAGE-XR) 500 MG 24 hr tablet Take 1,000 mg by mouth 2 (two) times daily with a meal.   metoprolol  succinate (TOPROL -XL) 25 MG 24 hr tablet Take 0.5 tablets (12.5 mg total) by mouth daily.  Multiple Vitamins-Minerals (MULTIVITAMIN WOMEN PO) Take 1 tablet by mouth daily.   ondansetron (ZOFRAN-ODT) 4 MG disintegrating tablet Dissolve 1 tablet (4 mg total) under tounge every 6 (six) hours as needed for nausea or vomiting.     Allergies:   Patient has no known allergies.   Social History   Socioeconomic History   Marital status: Married    Spouse name: Not on file   Number of children: 1   Years of education: Not on file   Highest education level: Not on file  Occupational History   Not on file  Tobacco Use   Smoking status: Former    Types: Cigarettes   Smokeless tobacco: Never  Vaping Use   Vaping status: Never Used  Substance and Sexual Activity   Alcohol use: Yes    Comment: rare   Drug use: Not  Currently   Sexual activity: Yes  Other Topics Concern   Not on file  Social History Narrative   Retired marine scientist   Social Drivers of Health   Financial Resource Strain: Not on file  Food Insecurity: No Food Insecurity (03/26/2024)   Hunger Vital Sign    Worried About Running Out of Food in the Last Year: Never true    Ran Out of Food in the Last Year: Never true  Transportation Needs: No Transportation Needs (03/26/2024)   PRAPARE - Administrator, Civil Service (Medical): No    Lack of Transportation (Non-Medical): No  Physical Activity: Not on file  Stress: Not on file  Social Connections: Moderately Integrated (03/26/2024)   Social Connection and Isolation Panel    Frequency of Communication with Friends and Family: Three times a week    Frequency of Social Gatherings with Friends and Family: Once a week    Attends Religious Services: Never    Database Administrator or Organizations: No    Attends Engineer, Structural: 1 to 4 times per year    Marital Status: Married     Family History: The patient's family history includes Autoimmune disease in her sister; Cancer in her brother and father; Colitis in her sister; Dementia in her mother; Heart disease in her father; Leukemia in her father.  ROS:   Please see the history of present illness.     All other systems reviewed and are negative.  EKGs/Labs/Other Studies Reviewed:    The following studies were reviewed today:       Recent Labs: 03/27/2024: Hemoglobin 12.6; Platelets 257 03/29/2024: BUN 13; Creatinine, Ser 0.63; Potassium 3.9; Sodium 138  Recent Lipid Panel No results found for: CHOL, TRIG, HDL, CHOLHDL, VLDL, LDLCALC, LDLDIRECT Dated 08/30/22: CBC normal. Glucose 112 otherwise CMET normal. A1c 5.6%.  Dated 02/11/23: cholesterol 222, triglycerides 64, HDL 99, LDL 112. TSH 1.1.  Dateed 09/01/23: TSH 0.601. A1c 6.2%. Hgb 10.3. plts 466K. CMET normal. Cholesterol 192,  triglycerides 54, HDL 78, LDL 104.   Risk Assessment/Calculations:    CHA2DS2-VASc Score = 2   This indicates a 2.2% annual risk of stroke. The patient's score is based upon: CHF History: 0 HTN History: 0 Diabetes History: 0 Stroke History: 0 Vascular Disease History: 0 Age Score: 1 Gender Score: 1                Physical Exam:    VS:  BP 119/78   Pulse 92   Ht 5' 7 (1.702 m)   Wt 185 lb 3.2 oz (84 kg)   LMP  (LMP Unknown)  SpO2 94%   BMI 29.01 kg/m     Wt Readings from Last 3 Encounters:  04/16/24 185 lb 3.2 oz (84 kg)  03/26/24 188 lb (85.3 kg)  03/19/24 189 lb (85.7 kg)     GEN:  Well nourished, well developed in no acute distress HEENT: Normal NECK: No JVD; No carotid bruits LYMPHATICS: No lymphadenopathy CARDIAC: RRR, no murmurs, rubs, gallops RESPIRATORY:  Clear to auscultation without rales, wheezing or rhonchi  ABDOMEN: Soft, non-tender, non-distended MUSCULOSKELETAL:  No edema; No deformity  SKIN: Warm and dry NEUROLOGIC:  Alert and oriented x 3 PSYCHIATRIC:  Normal affect   ASSESSMENT:    1. Paroxysmal atrial fibrillation (HCC)     PLAN:    In order of problems listed above:  Dyspnea on exertion and fatigue. I think this is due to her large periesophageal hernia and anemia. Echo was normal. On CT she has no coronary or aortic calcification making risk for CAD low.  Will need to monitor symptoms as she recovers from surgery. Atrial fibrillation paroxysmal. This may be impacted by her hernia as well. Now in NSR and pulse regular today. Will continue low dose metoprolol . I would like to reassess burden of Afib  to help decide need for anticoagulation, ablation or AAD therapy. Will plan 2 week Zio patch monitor. months post op and consider Also discussed other ways to monitor rhythm including Apple watch or Kardiomobile device. If she does have Afib on follow up would recommend anticoagulation rather than ASA. If bleeding is an ongoing concern could  consider a Watchman device. Follow up pending results of monitor.  Iron deficiency anemia.  S/p repair of large periesophageal hernia.            Medication Adjustments/Labs and Tests Ordered: Current medicines are reviewed at length with the patient today.  Concerns regarding medicines are outlined above.  Orders Placed This Encounter  Procedures   LONG TERM MONITOR (3-14 DAYS)   No orders of the defined types were placed in this encounter.   Patient Instructions  Medication Instructions:   *If you need a refill on your cardiac medications before your next appointment, please call your pharmacy*  Lab Work:  If you have labs (blood work) drawn today and your tests are completely normal, you will receive your results only by: MyChart Message (if you have MyChart) OR A paper copy in the mail If you have any lab test that is abnormal or we need to change your treatment, we will call you to review the results.  Testing/Procedures:   Follow-Up: At [facility], you and your health needs are our priority.  As part of our continuing mission to provide you with exceptional heart care, our providers are all part of one team.  This team includes your primary Cardiologist (physician) and Advanced Practice Providers or APPs (Physician Assistants and Nurse Practitioners) who all work together to provide you with the care you need, when you need it.  Your next appointment:      Provider:       We recommend signing up for the patient portal called MyChart.  Sign up information is provided on this After Visit Summary.  MyChart is used to connect with patients for Virtual Visits (Telemedicine).  Patients are able to view lab/test results, encounter notes, upcoming appointments, etc.  Non-urgent messages can be sent to your provider as well.   To learn more about what you can do with MyChart, go to [email or web address].   Other  Instructions          Signed, Johnny Latu, MD  04/16/2024 10:46 AM    [facility]

## 2024-04-16 ENCOUNTER — Encounter: Payer: Self-pay | Admitting: Cardiology

## 2024-04-16 ENCOUNTER — Ambulatory Visit: Attending: Cardiology | Admitting: Cardiology

## 2024-04-16 ENCOUNTER — Ambulatory Visit

## 2024-04-16 VITALS — BP 119/78 | HR 92 | Ht 67.0 in | Wt 185.2 lb

## 2024-04-16 DIAGNOSIS — K449 Diaphragmatic hernia without obstruction or gangrene: Secondary | ICD-10-CM

## 2024-04-16 DIAGNOSIS — I480 Paroxysmal atrial fibrillation: Secondary | ICD-10-CM | POA: Insufficient documentation

## 2024-04-16 NOTE — Progress Notes (Unsigned)
 Enrolled for [employer] to mail a ZIO XT long term holter monitor to the patients address on file.   Requested deliver 04/23/24.

## 2024-04-16 NOTE — Patient Instructions (Addendum)
 Medication Instructions:  Continue same medications  Lab Work: None ordered  Testing/Procedures: 2 week Heart Monitor   Will be mailed to your home with instructions  Follow-Up: At [facility], you and your health needs are our priority.  As part of our continuing mission to provide you with exceptional heart care, our providers are all part of one team.  This team includes your primary Cardiologist (physician) and Advanced Practice Providers or APPs (Physician Assistants and Nurse Practitioners) who all work together to provide you with the care you need, when you need it.  Your next appointment:  To Be Determined after monitor     Provider:  Dr.Jordan     We recommend signing up for the patient portal called MyChart.  Sign up information is provided on this After Visit Summary.  MyChart is used to connect with patients for Virtual Visits (Telemedicine).  Patients are able to view lab/test results, encounter notes, upcoming appointments, etc.  Non-urgent messages can be sent to your provider as well.   To learn more about what you can do with MyChart, go to [email or web address].   Other Instructions

## 2024-05-11 DIAGNOSIS — I480 Paroxysmal atrial fibrillation: Secondary | ICD-10-CM | POA: Diagnosis not present

## 2024-05-14 ENCOUNTER — Telehealth: Payer: Self-pay | Admitting: Cardiology

## 2024-05-14 ENCOUNTER — Ambulatory Visit: Payer: Self-pay | Admitting: Cardiology

## 2024-05-14 DIAGNOSIS — I480 Paroxysmal atrial fibrillation: Secondary | ICD-10-CM | POA: Diagnosis not present

## 2024-05-14 NOTE — Telephone Encounter (Signed)
Pt is calling to get her results 

## 2024-05-14 NOTE — Telephone Encounter (Signed)
 Pt calling with questions about monitor results. Please advise.

## 2024-05-15 NOTE — Telephone Encounter (Signed)
 Pt contacted and advised. Pt agrees.

## 2024-05-15 NOTE — Telephone Encounter (Signed)
 Spoke with patient regarding monitor results. Stated she felt tired after her hiatal hernia surgery, and wanted to make sure it wasn't from the SVT. Reinforced the very brief runs of SVT per Dr. Jordan, and pt stated this was chronic issue. Inquiring about using a vibration plate for bone density improvement and lipid benefits and drinking smart water  with electrolytes. Advised pt that smart water  would be a good hydration and would ask Dr. Jordan for any thoughts against the vibration plate for activity. Will send to Dr. Jordan for any advisement. Pt verbalizes understanding.

## 2024-05-15 NOTE — Telephone Encounter (Signed)
 Shared response from Dr. Jordan with patient.   Patient had a question about GLP-1 agonists, and would like to know if it would be safe for her with her heart condition and current medications to take this. She states her insurance does not cover these medications, but would like to know if it was OK in case she decides to pay out of pocket for them. She reports seeing a post on Facebook about microdoses of GLP-1 agonists and was interested.  Will forward to Dr. Jordan and Pharm D to review.

## 2024-11-14 ENCOUNTER — Ambulatory Visit: Admitting: Cardiology
